# Patient Record
Sex: Female | Born: 1965 | Race: White | Hispanic: No | Smoking: Current some day smoker
Health system: Southern US, Community
[De-identification: ages and names within clinical notes are randomized; demographics above are authoritative.]

## PROBLEM LIST (undated history)

## (undated) DIAGNOSIS — D508 Other iron deficiency anemias: Secondary | ICD-10-CM

## (undated) DIAGNOSIS — F329 Major depressive disorder, single episode, unspecified: Secondary | ICD-10-CM

## (undated) DIAGNOSIS — J309 Allergic rhinitis, unspecified: Secondary | ICD-10-CM

## (undated) DIAGNOSIS — J449 Chronic obstructive pulmonary disease, unspecified: Secondary | ICD-10-CM

## (undated) DIAGNOSIS — Z91199 Patient's noncompliance with other medical treatment and regimen due to unspecified reason: Secondary | ICD-10-CM

## (undated) DIAGNOSIS — L409 Psoriasis, unspecified: Secondary | ICD-10-CM

## (undated) DIAGNOSIS — J069 Acute upper respiratory infection, unspecified: Secondary | ICD-10-CM

## (undated) DIAGNOSIS — G4733 Obstructive sleep apnea (adult) (pediatric): Secondary | ICD-10-CM

## (undated) DIAGNOSIS — K589 Irritable bowel syndrome without diarrhea: Secondary | ICD-10-CM

## (undated) DIAGNOSIS — E782 Mixed hyperlipidemia: Secondary | ICD-10-CM

## (undated) DIAGNOSIS — Z9989 Dependence on other enabling machines and devices: Secondary | ICD-10-CM

## (undated) DIAGNOSIS — F32A Depression, unspecified: Secondary | ICD-10-CM

## (undated) DIAGNOSIS — E1165 Type 2 diabetes mellitus with hyperglycemia: Secondary | ICD-10-CM

## (undated) DIAGNOSIS — E119 Type 2 diabetes mellitus without complications: Secondary | ICD-10-CM

## (undated) DIAGNOSIS — F419 Anxiety disorder, unspecified: Secondary | ICD-10-CM

## (undated) DIAGNOSIS — J45909 Unspecified asthma, uncomplicated: Secondary | ICD-10-CM

## (undated) DIAGNOSIS — E781 Pure hyperglyceridemia: Secondary | ICD-10-CM

## (undated) HISTORY — DX: Obstructive sleep apnea (adult) (pediatric): G47.33

## (undated) HISTORY — DX: Dependence on other enabling machines and devices: Z99.89

## (undated) HISTORY — DX: Morbid (severe) obesity due to excess calories: E66.01

## (undated) HISTORY — DX: Depression, unspecified: F32.A

## (undated) HISTORY — DX: Other iron deficiency anemias: D50.8

## (undated) HISTORY — DX: Pure hyperglyceridemia: E78.1

## (undated) HISTORY — DX: Psoriasis, unspecified: L40.9

## (undated) HISTORY — DX: Type 2 diabetes mellitus with hyperglycemia: E11.65

## (undated) HISTORY — DX: Irritable bowel syndrome, unspecified: K58.9

## (undated) HISTORY — DX: Acute upper respiratory infection, unspecified: J06.9

## (undated) HISTORY — DX: Chronic obstructive pulmonary disease, unspecified: J44.9

## (undated) HISTORY — DX: Major depressive disorder, single episode, unspecified: F32.9

## (undated) HISTORY — PX: COLONOSCOPY: SHX174

## (undated) HISTORY — DX: Patient's noncompliance with other medical treatment and regimen due to unspecified reason: Z91.199

## (undated) HISTORY — DX: Allergic rhinitis, unspecified: J30.9

## (undated) HISTORY — DX: Mixed hyperlipidemia: E78.2

## (undated) HISTORY — DX: Anxiety disorder, unspecified: F41.9

---

## 2002-08-27 ENCOUNTER — Ambulatory Visit (HOSPITAL_COMMUNITY): Admission: RE | Admit: 2002-08-27 | Discharge: 2002-08-27 | Payer: Self-pay | Admitting: Family Medicine

## 2002-08-27 ENCOUNTER — Encounter: Payer: Self-pay | Admitting: Family Medicine

## 2003-07-25 ENCOUNTER — Other Ambulatory Visit: Admission: RE | Admit: 2003-07-25 | Discharge: 2003-07-25 | Payer: Self-pay | Admitting: Obstetrics and Gynecology

## 2004-02-29 ENCOUNTER — Ambulatory Visit (HOSPITAL_COMMUNITY): Admission: RE | Admit: 2004-02-29 | Discharge: 2004-02-29 | Payer: Self-pay | Admitting: Family Medicine

## 2004-08-08 ENCOUNTER — Other Ambulatory Visit: Admission: RE | Admit: 2004-08-08 | Discharge: 2004-08-08 | Payer: Self-pay | Admitting: Obstetrics and Gynecology

## 2004-10-19 ENCOUNTER — Ambulatory Visit (HOSPITAL_COMMUNITY): Admission: RE | Admit: 2004-10-19 | Discharge: 2004-10-19 | Payer: Self-pay | Admitting: Family Medicine

## 2009-03-16 ENCOUNTER — Encounter: Admission: RE | Admit: 2009-03-16 | Discharge: 2009-03-16 | Payer: Self-pay | Admitting: Obstetrics and Gynecology

## 2010-04-16 ENCOUNTER — Encounter (INDEPENDENT_AMBULATORY_CARE_PROVIDER_SITE_OTHER): Payer: Self-pay | Admitting: *Deleted

## 2010-07-21 ENCOUNTER — Other Ambulatory Visit: Payer: Self-pay | Admitting: Obstetrics and Gynecology

## 2010-07-21 DIAGNOSIS — N6320 Unspecified lump in the left breast, unspecified quadrant: Secondary | ICD-10-CM

## 2010-07-22 ENCOUNTER — Encounter: Payer: Self-pay | Admitting: Internal Medicine

## 2010-07-31 NOTE — Letter (Signed)
Summary: Unable to Reach, Consult Scheduled  [facility]  [address]   [place], [place] [address]   Phone: [phone]  Fax: [phone]    04/16/2010  Kristin Austin [address] [place], [place]  [address] 05-03-1966   Dear [name],   We have been unable to reach you by phone.  Please contact our office with an updated phone number.  At the recommendation of DR Phillips Odor we have been asked to schedule you a consult with DR [facility] DIARRHEA.   Please call our office at [phone].     Thank you,    Diana Eves  [facility] R. Roetta Sessions, M.D.    Jonette Eva, M.D. Lorenza Burton, FNP-BC    Tana Coast, PA-C Phone: [phone]    Fax: [phone]

## 2011-09-05 ENCOUNTER — Other Ambulatory Visit: Payer: Self-pay | Admitting: Obstetrics and Gynecology

## 2011-09-05 ENCOUNTER — Ambulatory Visit
Admission: RE | Admit: 2011-09-05 | Discharge: 2011-09-05 | Disposition: A | Discharge status: Home / Self Care | Payer: BC Managed Care – PPO | Source: Ambulatory Visit | Attending: Obstetrics and Gynecology | Admitting: Obstetrics and Gynecology

## 2011-09-05 DIAGNOSIS — N6320 Unspecified lump in the left breast, unspecified quadrant: Secondary | ICD-10-CM

## 2012-05-08 ENCOUNTER — Ambulatory Visit (HOSPITAL_COMMUNITY)
Admission: RE | Admit: 2012-05-08 | Discharge: 2012-05-08 | Disposition: A | Discharge status: Home / Self Care | Payer: BC Managed Care – PPO | Source: Ambulatory Visit | Attending: Physician Assistant | Admitting: Physician Assistant

## 2012-05-08 ENCOUNTER — Other Ambulatory Visit (HOSPITAL_COMMUNITY): Payer: Self-pay | Admitting: Physician Assistant

## 2012-05-08 DIAGNOSIS — R0602 Shortness of breath: Secondary | ICD-10-CM | POA: Insufficient documentation

## 2012-05-08 DIAGNOSIS — F17200 Nicotine dependence, unspecified, uncomplicated: Secondary | ICD-10-CM | POA: Insufficient documentation

## 2012-05-08 DIAGNOSIS — Z0000 Encounter for general adult medical examination without abnormal findings: Secondary | ICD-10-CM

## 2012-05-08 DIAGNOSIS — R05 Cough: Secondary | ICD-10-CM

## 2012-05-08 DIAGNOSIS — R059 Cough, unspecified: Secondary | ICD-10-CM

## 2012-09-16 ENCOUNTER — Emergency Department (HOSPITAL_COMMUNITY)
Admission: EM | Admit: 2012-09-16 | Discharge: 2012-09-17 | Disposition: A | Discharge status: Home / Self Care | Payer: BC Managed Care – PPO | Attending: Emergency Medicine | Admitting: Emergency Medicine

## 2012-09-16 ENCOUNTER — Encounter (HOSPITAL_COMMUNITY): Payer: Self-pay

## 2012-09-16 DIAGNOSIS — IMO0001 Reserved for inherently not codable concepts without codable children: Secondary | ICD-10-CM | POA: Insufficient documentation

## 2012-09-16 DIAGNOSIS — Z79899 Other long term (current) drug therapy: Secondary | ICD-10-CM | POA: Insufficient documentation

## 2012-09-16 DIAGNOSIS — F17200 Nicotine dependence, unspecified, uncomplicated: Secondary | ICD-10-CM | POA: Insufficient documentation

## 2012-09-16 DIAGNOSIS — J45909 Unspecified asthma, uncomplicated: Secondary | ICD-10-CM | POA: Insufficient documentation

## 2012-09-16 DIAGNOSIS — M791 Myalgia, unspecified site: Secondary | ICD-10-CM

## 2012-09-16 HISTORY — DX: Unspecified asthma, uncomplicated: J45.909

## 2012-09-16 LAB — URINALYSIS, ROUTINE W REFLEX MICROSCOPIC
Ketones, ur: NEGATIVE mg/dL
Leukocytes, UA: NEGATIVE
Nitrite: NEGATIVE
Specific Gravity, Urine: >1.03 — ABNORMAL HIGH (ref 1.005–1.030)
pH: 6 (ref 5.0–8.0)

## 2012-09-16 MED ORDER — IBUPROFEN 800 MG PO TABS
800.0000 mg | ORAL_TABLET | Freq: Once | ORAL | Status: AC
  Administered 2012-09-16: 800 mg via ORAL
  Filled 2012-09-16: qty 1

## 2012-09-16 MED ORDER — CYCLOBENZAPRINE HCL 10 MG PO TABS
10.0000 mg | ORAL_TABLET | Freq: Once | ORAL | Status: AC
Start: 2012-09-16 — End: 2012-09-16
  Administered 2012-09-16: 10 mg via ORAL
  Filled 2012-09-16: qty 1

## 2012-09-16 NOTE — ED Notes (Signed)
Lower right side abd pain, onset last week after a cough. Now pain increased and radiating to right flank. Also had vomiting/diarrhea 09/05/12

## 2012-09-16 NOTE — ED Provider Notes (Signed)
History  This chart was scribed for [employer]. Colon Branch, MD by Bennett Scrape, ED Scribe. This patient was seen in room APA12/APA12 and the patient's care was started at 11:07 PM.  CSN: [id number]  Arrival date & time 09/16/12  2134   First MD Initiated Contact with Patient 09/16/12 2307      Chief Complaint  Patient presents with  . Abdominal Pain    The history is provided by the patient. No language interpreter was used.    Kristin Austin is a 47 y.o. female who presents to the Emergency Department complaining of 7 days of gradual onset, constant RLQ abdominal pain that radiates to the right flank that became worse after an unanticipated sneeze at work today. The pain is improved with laying and is worsened with movement, smoker's cough and sneezing. She denies taking OTC medications at home to improve symptoms. She reports that she has diarrhea from a virus that she had last week but denies any changes. She denies any other symptoms. She has a h/o asthma and is a current everyday smoker but denies alcohol use.  Past Medical History  Diagnosis Date  . Asthma     History reviewed. No pertinent past surgical history.  History reviewed. No pertinent family history.  History  Substance Use Topics  . Smoking status: Current Every Day Smoker -- 1.00 packs/day for 20 years    Types: Cigarettes  . Smokeless tobacco: Not on file  . Alcohol Use: No    No OB history provided.  Review of Systems  A complete 10 system review of systems was obtained and all systems are negative except as noted in the HPI and PMH.   Allergies  Codeine and Penicillins  Home Medications   Current Outpatient Rx  Name  Route  Sig  Dispense  Refill  . acetaminophen (TYLENOL) 500 MG tablet   Oral   Take 1,000 mg by mouth daily as needed for pain.         [name] buPROPion (WELLBUTRIN XL) 300 MG 24 hr tablet   Oral   Take 300 mg by mouth every morning.         . escitalopram (LEXAPRO) 10 MG tablet  Oral   Take 10 mg by mouth at bedtime.         . pravastatin (PRAVACHOL) 40 MG tablet   Oral   Take 40 mg by mouth at bedtime.           Triage Vitals: BP 140/79  Pulse 95  Temp(Src) 97.9 F (36.6 C) (Oral)  Ht 5\' 3"  (1.6 m)  Wt 220 lb (99.791 kg)  BMI 38.98 kg/m2  SpO2 98%  LMP 08/15/2012  Physical Exam  Nursing note and vitals reviewed. Constitutional: She is oriented to person, place, and time. She appears well-developed and well-nourished. No distress.  HENT:  Head: Normocephalic and atraumatic.  Eyes: EOM are normal.  Neck: Neck supple. No tracheal deviation present.  Cardiovascular: Normal rate.   Pulmonary/Chest: Effort normal. No respiratory distress.  Musculoskeletal: Normal range of motion.  Neurological: She is alert and oriented to person, place, and time.  Skin: Skin is warm and dry.  Psychiatric: She has a normal mood and affect. Her behavior is normal.    ED Course  Procedures (including critical care time) Results for orders placed during the hospital encounter of 09/16/12  URINALYSIS, ROUTINE W REFLEX MICROSCOPIC      Result Value Range   Color, Urine YELLOW  YELLOW  APPearance CLEAR  CLEAR   Specific Gravity, Urine >1.030 (*) 1.005 - 1.030   pH 6.0  5.0 - 8.0   Glucose, UA NEGATIVE  NEGATIVE mg/dL   Hgb urine dipstick NEGATIVE  NEGATIVE   Bilirubin Urine NEGATIVE  NEGATIVE   Ketones, ur NEGATIVE  NEGATIVE mg/dL   Protein, ur NEGATIVE  NEGATIVE mg/dL   Urobilinogen, UA 0.2  0.0 - 1.0 mg/dL   Nitrite NEGATIVE  NEGATIVE   Leukocytes, UA NEGATIVE  NEGATIVE   DIAGNOSTIC STUDIES: Oxygen Saturation is 98% on room air, normal by my interpretation.    COORDINATION OF CARE: 11:29 PM-Informed pt that her symptoms are from a pulled muscle. Discussed discharge plan which includes ibuprofen and heat with pt and pt agreed to plan.     MDM  Patient with muscle pain to the right side made worse with coughing and sneezing. Given ibuprofen and  flexeril. Pt stable in ED with no significant deterioration in condition.The patient appears reasonably screened and/or stabilized for discharge and I doubt any other medical condition or other [facility] requiring further screening, evaluation, or treatment in the ED at this time prior to discharge.  I personally performed the services described in this documentation, which was scribed in my presence. The recorded information has been reviewed and considered.   MDM Reviewed: nursing note and vitals Interpretation: labs           Nicoletta Dress. Colon Branch, MD 09/17/12 0111

## 2012-09-17 MED ORDER — CYCLOBENZAPRINE HCL 10 MG PO TABS: 10.0000 mg | ORAL_TABLET | Freq: Two times a day (BID) | ORAL | Status: DC | PRN

## 2012-09-17 MED ORDER — IBUPROFEN 800 MG PO TABS: 800.0000 mg | ORAL_TABLET | Freq: Three times a day (TID) | ORAL | Status: DC

## 2012-11-02 ENCOUNTER — Other Ambulatory Visit: Payer: Self-pay | Admitting: Obstetrics and Gynecology

## 2012-11-02 DIAGNOSIS — R921 Mammographic calcification found on diagnostic imaging of breast: Secondary | ICD-10-CM

## 2013-01-08 ENCOUNTER — Other Ambulatory Visit: Payer: Self-pay | Admitting: Obstetrics and Gynecology

## 2013-01-08 ENCOUNTER — Ambulatory Visit
Admission: RE | Admit: 2013-01-08 | Discharge: 2013-01-08 | Disposition: A | Discharge status: Home / Self Care | Payer: BC Managed Care – PPO | Source: Ambulatory Visit | Attending: Obstetrics and Gynecology | Admitting: Obstetrics and Gynecology

## 2013-01-08 DIAGNOSIS — R921 Mammographic calcification found on diagnostic imaging of breast: Secondary | ICD-10-CM

## 2013-03-08 ENCOUNTER — Ambulatory Visit
Admission: RE | Admit: 2013-03-08 | Discharge: 2013-03-08 | Disposition: A | Discharge status: Home / Self Care | Payer: BC Managed Care – PPO | Source: Ambulatory Visit | Attending: Obstetrics and Gynecology | Admitting: Obstetrics and Gynecology

## 2013-03-08 DIAGNOSIS — R921 Mammographic calcification found on diagnostic imaging of breast: Secondary | ICD-10-CM

## 2014-07-11 ENCOUNTER — Encounter: Payer: Self-pay | Admitting: Internal Medicine

## 2014-07-14 ENCOUNTER — Other Ambulatory Visit: Payer: Self-pay | Admitting: Obstetrics and Gynecology

## 2014-07-14 DIAGNOSIS — R921 Mammographic calcification found on diagnostic imaging of breast: Secondary | ICD-10-CM

## 2014-07-15 ENCOUNTER — Other Ambulatory Visit: Payer: Self-pay | Admitting: Obstetrics and Gynecology

## 2014-07-18 LAB — CYTOLOGY - PAP

## 2014-07-29 ENCOUNTER — Inpatient Hospital Stay: Admission: RE | Admit: 2014-07-29 | Payer: Self-pay | Source: Ambulatory Visit

## 2014-07-29 ENCOUNTER — Other Ambulatory Visit: Payer: Self-pay

## 2014-08-01 ENCOUNTER — Ambulatory Visit: Payer: BC Managed Care – PPO | Admitting: Nurse Practitioner

## 2014-08-19 ENCOUNTER — Ambulatory Visit
Admission: RE | Admit: 2014-08-19 | Discharge: 2014-08-19 | Disposition: A | Discharge status: Home / Self Care | Payer: BLUE CROSS/BLUE SHIELD | Source: Ambulatory Visit | Attending: Obstetrics and Gynecology | Admitting: Obstetrics and Gynecology

## 2014-08-19 ENCOUNTER — Other Ambulatory Visit: Payer: Self-pay | Admitting: Obstetrics and Gynecology

## 2014-08-19 DIAGNOSIS — R921 Mammographic calcification found on diagnostic imaging of breast: Secondary | ICD-10-CM

## 2014-09-05 ENCOUNTER — Ambulatory Visit: Payer: Self-pay | Admitting: Internal Medicine

## 2014-11-15 ENCOUNTER — Ambulatory Visit (HOSPITAL_COMMUNITY)
Admission: RE | Admit: 2014-11-15 | Discharge: 2014-11-15 | Disposition: A | Discharge status: Home / Self Care | Payer: BLUE CROSS/BLUE SHIELD | Source: Ambulatory Visit | Attending: Family Medicine | Admitting: Family Medicine

## 2014-11-15 ENCOUNTER — Other Ambulatory Visit (HOSPITAL_COMMUNITY): Payer: Self-pay | Admitting: Family Medicine

## 2014-11-15 DIAGNOSIS — F17210 Nicotine dependence, cigarettes, uncomplicated: Secondary | ICD-10-CM

## 2014-11-15 DIAGNOSIS — R0602 Shortness of breath: Secondary | ICD-10-CM

## 2014-11-15 DIAGNOSIS — R05 Cough: Secondary | ICD-10-CM | POA: Diagnosis not present

## 2014-12-22 ENCOUNTER — Other Ambulatory Visit: Payer: Self-pay | Admitting: Obstetrics and Gynecology

## 2014-12-22 DIAGNOSIS — R921 Mammographic calcification found on diagnostic imaging of breast: Secondary | ICD-10-CM

## 2014-12-23 ENCOUNTER — Other Ambulatory Visit: Payer: Self-pay | Admitting: Obstetrics and Gynecology

## 2014-12-23 ENCOUNTER — Other Ambulatory Visit: Payer: Self-pay

## 2014-12-23 DIAGNOSIS — R921 Mammographic calcification found on diagnostic imaging of breast: Secondary | ICD-10-CM

## 2015-02-24 ENCOUNTER — Other Ambulatory Visit: Payer: BLUE CROSS/BLUE SHIELD

## 2015-03-24 ENCOUNTER — Other Ambulatory Visit: Payer: Self-pay | Admitting: Obstetrics and Gynecology

## 2015-03-24 ENCOUNTER — Ambulatory Visit
Admission: RE | Admit: 2015-03-24 | Discharge: 2015-03-24 | Disposition: A | Discharge status: Home / Self Care | Payer: BLUE CROSS/BLUE SHIELD | Source: Ambulatory Visit | Attending: Obstetrics and Gynecology | Admitting: Obstetrics and Gynecology

## 2015-03-24 DIAGNOSIS — R921 Mammographic calcification found on diagnostic imaging of breast: Secondary | ICD-10-CM

## 2015-07-05 ENCOUNTER — Encounter: Payer: Self-pay | Admitting: *Deleted

## 2015-07-14 ENCOUNTER — Ambulatory Visit: Payer: BLUE CROSS/BLUE SHIELD | Admitting: Cardiology

## 2015-10-27 ENCOUNTER — Encounter: Payer: Self-pay | Admitting: Cardiology

## 2015-10-27 ENCOUNTER — Ambulatory Visit (INDEPENDENT_AMBULATORY_CARE_PROVIDER_SITE_OTHER): Payer: BLUE CROSS/BLUE SHIELD | Admitting: Cardiology

## 2015-10-27 ENCOUNTER — Encounter: Payer: BLUE CROSS/BLUE SHIELD | Admitting: Cardiology

## 2015-10-27 VITALS — BP 116/74 | HR 87 | Ht 63.0 in | Wt 215.0 lb

## 2015-10-27 DIAGNOSIS — R002 Palpitations: Secondary | ICD-10-CM

## 2015-10-27 DIAGNOSIS — Z720 Tobacco use: Secondary | ICD-10-CM

## 2015-10-27 DIAGNOSIS — R0600 Dyspnea, unspecified: Secondary | ICD-10-CM | POA: Diagnosis not present

## 2015-10-27 DIAGNOSIS — E663 Overweight: Secondary | ICD-10-CM

## 2015-10-27 NOTE — Progress Notes (Signed)
Cardiology Office Note  Date: 10/27/2015   ID: [name], DOB Feb 25, 1966, MRN [id number]  PCP: Colette Ribas, MD  Consulting Cardiologist: Nona Dell, MD   Chief Complaint  Patient presents with  . Palpitations    History of Present Illness: Kristin Austin is a 50 y.o. female referred for cardiology consultation by Dr. Phillips Odor. I reviewed her records and updated the chart. She reports a six-month history of a sense of palpitations, specifically describes a feeling that her heart rate is fast a lot of the time, sometimes forceful. She more often notices this at nighttime when she is going to sleep. She has had no associated dizziness or syncope. Reports mild dyspnea on exertion that has been stable, NYHA class II. She has obstructive sleep apnea and reports compliance with CPAP, generally sleeps well. She works in [employer] trader, does report some job stress. She is not exercising at this time and states that her diet is fairly poor.  Family history reviewed, no definitive cardiac arrhythmias or sudden cardiac death. She does have diabetes mellitus in both parents, states that she has been told that her blood sugars have been running high as well.  I reviewed her medications which are outlined below. She reports no new medications in the last 6 months.  Past Medical History  Diagnosis Date  . Asthma   . Allergic rhinitis   . Depression   . OSA on CPAP     History reviewed. No pertinent past surgical history.  Current Outpatient Prescriptions  Medication Sig Dispense Refill  . ibuprofen (ADVIL,MOTRIN) 800 MG tablet Take 1 tablet (800 mg total) by mouth 3 (three) times daily. 21 tablet 0  . buPROPion (WELLBUTRIN XL) 150 MG 24 hr tablet Take 150 mg by mouth daily.   5  . fenofibrate 160 MG tablet Take 160 mg by mouth daily.   5   No current facility-administered medications for this visit.   Allergies:  Codeine and Penicillins   Social History: The  patient  reports that she has been smoking Cigarettes.  She has a 20 pack-year smoking history. She does not have any smokeless tobacco history on file. She reports that she does not drink alcohol or use illicit drugs.   Family History: The patient's family history includes Cancer in her mother; Diabetes Mellitus II in her father and mother; Hypertension in her sister; Prostate cancer in her father.   ROS:  Please see the history of present illness. Otherwise, complete review of systems is positive for mild dyspnea exertion, history of asthma.  All other systems are reviewed and negative.   Physical Exam: VS:  BP 116/74 mmHg  Pulse 87  Ht  (1.6 m)  Wt 215 lb (97.523 kg)  BMI 38.09 kg/m2  SpO2 93%, BMI Body mass index is 38.09 kg/(m^2).  Wt Readings from Last 3 Encounters:  10/27/15 215 lb (97.523 kg)  09/16/12 220 lb (99.791 kg)    General: Overweight woman, appears comfortable at rest. HEENT: Conjunctiva and lids normal, oropharynx clear. Neck: Supple, no elevated JVP or carotid bruits, no thyromegaly. Lungs: Clear to auscultation, nonlabored breathing at rest. Cardiac: Regular rate and rhythm, no S3 or significant systolic murmur, no pericardial rub. Abdomen: Soft, nontender, bowel sounds present. Extremities: No pitting edema, distal pulses 2+. Skin: Warm and dry. Musculoskeletal: No kyphosis. Neuropsychiatric: Alert and oriented x3, affect grossly appropriate.  ECG: I personally reviewed the prior tracing from 04/20/2015 which showed sinus rhythm with  R' in lead V1 and lead artifact.  Recent Labwork:  October 2016: BUN 16, creatinine 0.75, potassium 4.0, AST 23, ALT 35, cholesterol 214, triglycerides 284, HDL 26, LDL 131, TSH 1.7, hemoglobin A1c 6.5  Other Studies Reviewed Today:  Chest x-ray 11/15/2014: FINDINGS: The lungs are adequately inflated and clear. The heart and pulmonary vascularity are normal. The mediastinum is normal in width. There is no pleural  effusion. The cardiac silhouette and pulmonary vascularity are normal. The trachea is midline. The bony thorax is unremarkable.  IMPRESSION: There is no active cardiopulmonary disease.  Assessment and Plan:  1. Palpitations, mainly describes a sense of fast heart rate, sometimes forceful heartbeats. She has had no associated dizziness or syncope. She reports nearly daily symptoms, we will obtain a 48-hour Holter monitor to further investigate for any potential arrhythmias or increased ectopy. She had a normal TSH done within the last 6 months. Baseline ECG is essentially normal.  2. Mild dyspnea on exertion, history of asthma and allergic rhinitis. With associated palpitations we will also obtain an echocardiogram to assess cardiac structure and function.  3. We discussed lifestyle modification including diet and exercise. She would likely benefit many respects from this. With weight loss her sleep apnea may be even better controlled and risk for developing type 2 diabetes mellitus will also decrease.  4. Ongoing tobacco abuse. Smoking cessation would be beneficial as well.  Current medicines were reviewed with the patient today.   Orders Placed This Encounter  Procedures  . Holter monitor - 48 hour  . Echocardiogram    Disposition: Call with results.   Signed, Jonelle SidleSamuel G. McDowell, MD, [facility] Hospital[facility]ACC 10/27/2015 1:37 PM    [facility] [address], [place], [place] [address] Phone: [phone]; Fax: [phone]

## 2015-10-27 NOTE — Progress Notes (Signed)
Error   This encounter was created in error - please disregard. 

## 2015-10-27 NOTE — Patient Instructions (Signed)
Your physician recommends that you schedule a follow-up appointment in: to be determined after tests    Your physician has requested that you have an echocardiogram. Echocardiography is a painless test that uses sound waves to create images of your heart. It provides your doctor with information about the size and shape of your heart and how well your heart's chambers and valves are working. This procedure takes approximately one hour. There are no restrictions for this procedure.  Your physician has recommended that you wear a holter monitor. Holter monitors are medical devices that record the heart's electrical activity. Doctors most often use these monitors to diagnose arrhythmias. Arrhythmias are problems with the speed or rhythm of the heartbeat. The monitor is a small, portable device. You can wear one while you do your normal daily activities. This is usually used to diagnose what is causing palpitations/syncope (passing out).    Thank you for choosing [facility] !         

## 2015-11-07 ENCOUNTER — Ambulatory Visit (HOSPITAL_COMMUNITY): Payer: BLUE CROSS/BLUE SHIELD

## 2015-11-07 ENCOUNTER — Ambulatory Visit (HOSPITAL_COMMUNITY): Admission: RE | Admit: 2015-11-07 | Payer: BLUE CROSS/BLUE SHIELD | Source: Ambulatory Visit

## 2015-11-09 ENCOUNTER — Ambulatory Visit: Payer: BLUE CROSS/BLUE SHIELD | Admitting: Cardiology

## 2015-11-14 ENCOUNTER — Ambulatory Visit (HOSPITAL_COMMUNITY)
Admission: RE | Admit: 2015-11-14 | Discharge: 2015-11-14 | Disposition: A | Discharge status: Home / Self Care | Payer: BLUE CROSS/BLUE SHIELD | Source: Ambulatory Visit | Attending: Cardiology | Admitting: Cardiology

## 2015-11-14 ENCOUNTER — Ambulatory Visit (HOSPITAL_BASED_OUTPATIENT_CLINIC_OR_DEPARTMENT_OTHER)
Admission: RE | Admit: 2015-11-14 | Discharge: 2015-11-14 | Disposition: A | Discharge status: Home / Self Care | Payer: BLUE CROSS/BLUE SHIELD | Source: Ambulatory Visit | Attending: Cardiology | Admitting: Cardiology

## 2015-11-14 DIAGNOSIS — R0600 Dyspnea, unspecified: Secondary | ICD-10-CM

## 2015-11-14 DIAGNOSIS — R002 Palpitations: Secondary | ICD-10-CM | POA: Diagnosis not present

## 2015-11-15 ENCOUNTER — Telehealth: Payer: Self-pay | Admitting: Cardiology

## 2015-11-15 ENCOUNTER — Telehealth: Payer: Self-pay | Admitting: *Deleted

## 2015-11-15 NOTE — Telephone Encounter (Signed)
Pt is returning Kisha's call

## 2015-11-15 NOTE — Telephone Encounter (Signed)
Called patient with test results. No answer. Left message to call back.  

## 2015-11-15 NOTE — Telephone Encounter (Signed)
Patient given test result

## 2015-11-15 NOTE — Telephone Encounter (Signed)
-----   Message from Jonelle SidleSamuel G McDowell, MD sent at 11/14/2015  2:38 PM EDT ----- Reviewed report. Normal LVEF and no major valvular abnormalities to explain symptoms. Overall reassuring.

## 2015-12-08 DIAGNOSIS — L259 Unspecified contact dermatitis, unspecified cause: Secondary | ICD-10-CM | POA: Diagnosis not present

## 2015-12-19 DIAGNOSIS — E781 Pure hyperglyceridemia: Secondary | ICD-10-CM | POA: Diagnosis not present

## 2015-12-19 DIAGNOSIS — J45909 Unspecified asthma, uncomplicated: Secondary | ICD-10-CM | POA: Diagnosis not present

## 2015-12-19 DIAGNOSIS — E119 Type 2 diabetes mellitus without complications: Secondary | ICD-10-CM | POA: Diagnosis not present

## 2015-12-19 DIAGNOSIS — Z1389 Encounter for screening for other disorder: Secondary | ICD-10-CM | POA: Diagnosis not present

## 2015-12-19 DIAGNOSIS — E782 Mixed hyperlipidemia: Secondary | ICD-10-CM | POA: Diagnosis not present

## 2015-12-21 DIAGNOSIS — E119 Type 2 diabetes mellitus without complications: Secondary | ICD-10-CM | POA: Diagnosis not present

## 2015-12-21 DIAGNOSIS — Z1389 Encounter for screening for other disorder: Secondary | ICD-10-CM | POA: Diagnosis not present

## 2015-12-21 DIAGNOSIS — E781 Pure hyperglyceridemia: Secondary | ICD-10-CM | POA: Diagnosis not present

## 2015-12-21 DIAGNOSIS — E782 Mixed hyperlipidemia: Secondary | ICD-10-CM | POA: Diagnosis not present

## 2016-02-16 DIAGNOSIS — Z01419 Encounter for gynecological examination (general) (routine) without abnormal findings: Secondary | ICD-10-CM | POA: Diagnosis not present

## 2016-02-16 DIAGNOSIS — Z6835 Body mass index (BMI) 35.0-35.9, adult: Secondary | ICD-10-CM | POA: Diagnosis not present

## 2016-03-14 DIAGNOSIS — E6609 Other obesity due to excess calories: Secondary | ICD-10-CM | POA: Diagnosis not present

## 2016-03-14 DIAGNOSIS — E119 Type 2 diabetes mellitus without complications: Secondary | ICD-10-CM | POA: Diagnosis not present

## 2016-03-14 DIAGNOSIS — Z1389 Encounter for screening for other disorder: Secondary | ICD-10-CM | POA: Diagnosis not present

## 2016-03-14 DIAGNOSIS — J301 Allergic rhinitis due to pollen: Secondary | ICD-10-CM | POA: Diagnosis not present

## 2016-03-14 DIAGNOSIS — Z6835 Body mass index (BMI) 35.0-35.9, adult: Secondary | ICD-10-CM | POA: Diagnosis not present

## 2016-03-30 ENCOUNTER — Emergency Department (HOSPITAL_COMMUNITY)
Admission: EM | Admit: 2016-03-30 | Discharge: 2016-03-30 | Disposition: A | Discharge status: Home / Self Care | Payer: BLUE CROSS/BLUE SHIELD | Attending: Emergency Medicine | Admitting: Emergency Medicine

## 2016-03-30 ENCOUNTER — Encounter (HOSPITAL_COMMUNITY): Payer: Self-pay | Admitting: Cardiology

## 2016-03-30 ENCOUNTER — Emergency Department (HOSPITAL_COMMUNITY): Payer: BLUE CROSS/BLUE SHIELD

## 2016-03-30 DIAGNOSIS — J45909 Unspecified asthma, uncomplicated: Secondary | ICD-10-CM | POA: Diagnosis not present

## 2016-03-30 DIAGNOSIS — F17210 Nicotine dependence, cigarettes, uncomplicated: Secondary | ICD-10-CM | POA: Diagnosis not present

## 2016-03-30 DIAGNOSIS — S8002XA Contusion of left knee, initial encounter: Secondary | ICD-10-CM | POA: Diagnosis not present

## 2016-03-30 DIAGNOSIS — M25562 Pain in left knee: Secondary | ICD-10-CM | POA: Diagnosis not present

## 2016-03-30 DIAGNOSIS — Z79899 Other long term (current) drug therapy: Secondary | ICD-10-CM | POA: Diagnosis not present

## 2016-03-30 MED ORDER — IBUPROFEN 800 MG PO TABS
800.0000 mg | ORAL_TABLET | Freq: Once | ORAL | Status: AC
  Administered 2016-03-30: 800 mg via ORAL
  Filled 2016-03-30: qty 1

## 2016-03-30 MED ORDER — IBUPROFEN 800 MG PO TABS: 800.0000 mg | ORAL_TABLET | Freq: Three times a day (TID) | ORAL | 0 refills | Status: DC

## 2016-03-30 NOTE — Discharge Instructions (Signed)
Xray normal Return if symptoms worsen / redness / fevers or increased  pain Motrin 3 times daily Family doctor in 1 week  Please obtain all of your results from medical records or have your doctors office obtain the results - share them with your doctor - you should be seen at your doctors office in the next 2 days. Call today to arrange your follow up. Take the medications as prescribed. Please review all of the medicines and only take them if you do not have an allergy to them. Please be aware that if you are taking birth control pills, taking other prescriptions, ESPECIALLY ANTIBIOTICS may make the birth control ineffective - if this is the case, either do not engage in sexual activity or use alternative methods of birth control such as condoms until you have finished the medicine and your family doctor says it is OK to restart them. If you are on a blood thinner such as COUMADIN, be aware that any other medicine that you take may cause the coumadin to either work too much, or not enough - you should have your coumadin level rechecked in next 7 days if this is the case.  ?  It is also a possibility that you have an allergic reaction to any of the medicines that you have been prescribed - Everybody reacts differently to medications and while MOST people have no trouble with most medicines, you may have a reaction such as nausea, vomiting, rash, swelling, shortness of breath. If this is the case, please stop taking the medicine immediately and contact your physician.  ?  You should return to the ER if you develop severe or worsening symptoms.
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
## 2016-04-23 DIAGNOSIS — J209 Acute bronchitis, unspecified: Secondary | ICD-10-CM | POA: Diagnosis not present

## 2016-04-23 DIAGNOSIS — R0602 Shortness of breath: Secondary | ICD-10-CM | POA: Diagnosis not present

## 2016-04-23 DIAGNOSIS — E6609 Other obesity due to excess calories: Secondary | ICD-10-CM | POA: Diagnosis not present

## 2016-04-23 DIAGNOSIS — Z6833 Body mass index (BMI) 33.0-33.9, adult: Secondary | ICD-10-CM | POA: Diagnosis not present

## 2016-04-23 DIAGNOSIS — Z1389 Encounter for screening for other disorder: Secondary | ICD-10-CM | POA: Diagnosis not present

## 2016-04-23 DIAGNOSIS — E119 Type 2 diabetes mellitus without complications: Secondary | ICD-10-CM | POA: Diagnosis not present

## 2016-05-07 DIAGNOSIS — R05 Cough: Secondary | ICD-10-CM | POA: Diagnosis not present

## 2016-05-07 DIAGNOSIS — J4521 Mild intermittent asthma with (acute) exacerbation: Secondary | ICD-10-CM | POA: Diagnosis not present

## 2016-05-07 DIAGNOSIS — F329 Major depressive disorder, single episode, unspecified: Secondary | ICD-10-CM | POA: Diagnosis not present

## 2016-05-07 DIAGNOSIS — Z6834 Body mass index (BMI) 34.0-34.9, adult: Secondary | ICD-10-CM | POA: Diagnosis not present

## 2016-05-07 DIAGNOSIS — Z1389 Encounter for screening for other disorder: Secondary | ICD-10-CM | POA: Diagnosis not present

## 2016-05-09 ENCOUNTER — Other Ambulatory Visit (HOSPITAL_COMMUNITY): Payer: Self-pay | Admitting: Internal Medicine

## 2016-05-09 ENCOUNTER — Ambulatory Visit (HOSPITAL_COMMUNITY)
Admission: RE | Admit: 2016-05-09 | Discharge: 2016-05-09 | Disposition: A | Discharge status: Home / Self Care | Payer: BLUE CROSS/BLUE SHIELD | Source: Ambulatory Visit | Attending: Internal Medicine | Admitting: Internal Medicine

## 2016-05-09 DIAGNOSIS — R05 Cough: Secondary | ICD-10-CM | POA: Diagnosis not present

## 2016-05-09 DIAGNOSIS — R059 Cough, unspecified: Secondary | ICD-10-CM

## 2016-08-06 DIAGNOSIS — N950 Postmenopausal bleeding: Secondary | ICD-10-CM | POA: Diagnosis not present

## 2016-09-02 ENCOUNTER — Other Ambulatory Visit: Payer: Self-pay | Admitting: Obstetrics and Gynecology

## 2016-09-02 DIAGNOSIS — N6001 Solitary cyst of right breast: Secondary | ICD-10-CM | POA: Diagnosis not present

## 2016-09-06 ENCOUNTER — Ambulatory Visit
Admission: RE | Admit: 2016-09-06 | Discharge: 2016-09-06 | Disposition: A | Discharge status: Home / Self Care | Payer: BLUE CROSS/BLUE SHIELD | Source: Ambulatory Visit | Attending: Obstetrics and Gynecology | Admitting: Obstetrics and Gynecology

## 2016-09-06 DIAGNOSIS — R921 Mammographic calcification found on diagnostic imaging of breast: Secondary | ICD-10-CM | POA: Diagnosis not present

## 2016-09-06 DIAGNOSIS — N6489 Other specified disorders of breast: Secondary | ICD-10-CM | POA: Diagnosis not present

## 2016-09-06 DIAGNOSIS — N6001 Solitary cyst of right breast: Secondary | ICD-10-CM

## 2016-09-13 DIAGNOSIS — N951 Menopausal and female climacteric states: Secondary | ICD-10-CM | POA: Diagnosis not present

## 2016-09-17 DIAGNOSIS — J45909 Unspecified asthma, uncomplicated: Secondary | ICD-10-CM | POA: Diagnosis not present

## 2016-09-17 DIAGNOSIS — R05 Cough: Secondary | ICD-10-CM | POA: Diagnosis not present

## 2016-09-17 DIAGNOSIS — Z6834 Body mass index (BMI) 34.0-34.9, adult: Secondary | ICD-10-CM | POA: Diagnosis not present

## 2016-09-17 DIAGNOSIS — J4530 Mild persistent asthma, uncomplicated: Secondary | ICD-10-CM | POA: Diagnosis not present

## 2016-09-17 DIAGNOSIS — Z1389 Encounter for screening for other disorder: Secondary | ICD-10-CM | POA: Diagnosis not present

## 2016-09-18 ENCOUNTER — Other Ambulatory Visit (HOSPITAL_COMMUNITY): Payer: Self-pay | Admitting: Family Medicine

## 2016-09-18 ENCOUNTER — Ambulatory Visit (HOSPITAL_COMMUNITY)
Admission: RE | Admit: 2016-09-18 | Discharge: 2016-09-18 | Disposition: A | Discharge status: Home / Self Care | Payer: BLUE CROSS/BLUE SHIELD | Source: Ambulatory Visit | Attending: Family Medicine | Admitting: Family Medicine

## 2016-09-18 DIAGNOSIS — R05 Cough: Secondary | ICD-10-CM | POA: Diagnosis not present

## 2016-09-18 DIAGNOSIS — R059 Cough, unspecified: Secondary | ICD-10-CM

## 2016-09-18 DIAGNOSIS — R0602 Shortness of breath: Secondary | ICD-10-CM | POA: Diagnosis not present

## 2016-10-25 DIAGNOSIS — H40013 Open angle with borderline findings, low risk, bilateral: Secondary | ICD-10-CM | POA: Diagnosis not present

## 2016-10-25 DIAGNOSIS — H31091 Other chorioretinal scars, right eye: Secondary | ICD-10-CM | POA: Diagnosis not present

## 2016-11-26 DIAGNOSIS — Z1389 Encounter for screening for other disorder: Secondary | ICD-10-CM | POA: Diagnosis not present

## 2016-11-26 DIAGNOSIS — Z6834 Body mass index (BMI) 34.0-34.9, adult: Secondary | ICD-10-CM | POA: Diagnosis not present

## 2016-11-26 DIAGNOSIS — R7309 Other abnormal glucose: Secondary | ICD-10-CM | POA: Diagnosis not present

## 2016-11-26 DIAGNOSIS — E6609 Other obesity due to excess calories: Secondary | ICD-10-CM | POA: Diagnosis not present

## 2016-11-26 DIAGNOSIS — E782 Mixed hyperlipidemia: Secondary | ICD-10-CM | POA: Diagnosis not present

## 2016-11-27 DIAGNOSIS — E119 Type 2 diabetes mellitus without complications: Secondary | ICD-10-CM | POA: Diagnosis not present

## 2016-11-27 DIAGNOSIS — Z6834 Body mass index (BMI) 34.0-34.9, adult: Secondary | ICD-10-CM | POA: Diagnosis not present

## 2016-11-27 DIAGNOSIS — E782 Mixed hyperlipidemia: Secondary | ICD-10-CM | POA: Diagnosis not present

## 2016-11-27 DIAGNOSIS — Z1389 Encounter for screening for other disorder: Secondary | ICD-10-CM | POA: Diagnosis not present

## 2017-02-06 DIAGNOSIS — N9089 Other specified noninflammatory disorders of vulva and perineum: Secondary | ICD-10-CM | POA: Diagnosis not present

## 2017-02-06 DIAGNOSIS — Z114 Encounter for screening for human immunodeficiency virus [HIV]: Secondary | ICD-10-CM | POA: Diagnosis not present

## 2017-02-07 DIAGNOSIS — E6609 Other obesity due to excess calories: Secondary | ICD-10-CM | POA: Diagnosis not present

## 2017-02-07 DIAGNOSIS — J301 Allergic rhinitis due to pollen: Secondary | ICD-10-CM | POA: Diagnosis not present

## 2017-02-07 DIAGNOSIS — Z6834 Body mass index (BMI) 34.0-34.9, adult: Secondary | ICD-10-CM | POA: Diagnosis not present

## 2017-02-13 DIAGNOSIS — L309 Dermatitis, unspecified: Secondary | ICD-10-CM | POA: Diagnosis not present

## 2017-04-24 DIAGNOSIS — M542 Cervicalgia: Secondary | ICD-10-CM | POA: Diagnosis not present

## 2017-04-24 DIAGNOSIS — M5441 Lumbago with sciatica, right side: Secondary | ICD-10-CM | POA: Diagnosis not present

## 2017-04-24 DIAGNOSIS — M9901 Segmental and somatic dysfunction of cervical region: Secondary | ICD-10-CM | POA: Diagnosis not present

## 2017-04-24 DIAGNOSIS — M9903 Segmental and somatic dysfunction of lumbar region: Secondary | ICD-10-CM | POA: Diagnosis not present

## 2017-04-25 DIAGNOSIS — M9903 Segmental and somatic dysfunction of lumbar region: Secondary | ICD-10-CM | POA: Diagnosis not present

## 2017-04-25 DIAGNOSIS — M9901 Segmental and somatic dysfunction of cervical region: Secondary | ICD-10-CM | POA: Diagnosis not present

## 2017-04-25 DIAGNOSIS — M5441 Lumbago with sciatica, right side: Secondary | ICD-10-CM | POA: Diagnosis not present

## 2017-04-25 DIAGNOSIS — M542 Cervicalgia: Secondary | ICD-10-CM | POA: Diagnosis not present

## 2017-05-02 DIAGNOSIS — M5441 Lumbago with sciatica, right side: Secondary | ICD-10-CM | POA: Diagnosis not present

## 2017-05-02 DIAGNOSIS — M9903 Segmental and somatic dysfunction of lumbar region: Secondary | ICD-10-CM | POA: Diagnosis not present

## 2017-05-02 DIAGNOSIS — M9901 Segmental and somatic dysfunction of cervical region: Secondary | ICD-10-CM | POA: Diagnosis not present

## 2017-05-02 DIAGNOSIS — M542 Cervicalgia: Secondary | ICD-10-CM | POA: Diagnosis not present

## 2017-06-06 DIAGNOSIS — M5416 Radiculopathy, lumbar region: Secondary | ICD-10-CM | POA: Diagnosis not present

## 2017-06-06 DIAGNOSIS — M545 Low back pain: Secondary | ICD-10-CM | POA: Diagnosis not present

## 2017-08-20 DIAGNOSIS — Z1389 Encounter for screening for other disorder: Secondary | ICD-10-CM | POA: Diagnosis not present

## 2017-08-20 DIAGNOSIS — E6609 Other obesity due to excess calories: Secondary | ICD-10-CM | POA: Diagnosis not present

## 2017-08-20 DIAGNOSIS — R6889 Other general symptoms and signs: Secondary | ICD-10-CM | POA: Diagnosis not present

## 2017-08-20 DIAGNOSIS — F330 Major depressive disorder, recurrent, mild: Secondary | ICD-10-CM | POA: Diagnosis not present

## 2017-08-20 DIAGNOSIS — B349 Viral infection, unspecified: Secondary | ICD-10-CM | POA: Diagnosis not present

## 2017-08-20 DIAGNOSIS — J069 Acute upper respiratory infection, unspecified: Secondary | ICD-10-CM | POA: Diagnosis not present

## 2017-09-26 DIAGNOSIS — L400 Psoriasis vulgaris: Secondary | ICD-10-CM | POA: Diagnosis not present

## 2017-09-30 ENCOUNTER — Other Ambulatory Visit: Payer: Self-pay | Admitting: Obstetrics and Gynecology

## 2017-09-30 ENCOUNTER — Other Ambulatory Visit: Payer: Self-pay

## 2017-09-30 DIAGNOSIS — N649 Disorder of breast, unspecified: Secondary | ICD-10-CM

## 2017-09-30 DIAGNOSIS — Z139 Encounter for screening, unspecified: Secondary | ICD-10-CM

## 2017-10-02 ENCOUNTER — Other Ambulatory Visit: Payer: Self-pay | Admitting: Orthopedic Surgery

## 2017-10-02 DIAGNOSIS — M5416 Radiculopathy, lumbar region: Secondary | ICD-10-CM

## 2017-10-03 ENCOUNTER — Ambulatory Visit
Admission: RE | Admit: 2017-10-03 | Discharge: 2017-10-03 | Disposition: A | Discharge status: Home / Self Care | Payer: BLUE CROSS/BLUE SHIELD | Source: Ambulatory Visit | Attending: Obstetrics and Gynecology | Admitting: Obstetrics and Gynecology

## 2017-10-03 DIAGNOSIS — R921 Mammographic calcification found on diagnostic imaging of breast: Secondary | ICD-10-CM | POA: Diagnosis not present

## 2017-10-03 DIAGNOSIS — N649 Disorder of breast, unspecified: Secondary | ICD-10-CM

## 2017-10-06 ENCOUNTER — Ambulatory Visit
Admission: RE | Admit: 2017-10-06 | Discharge: 2017-10-06 | Disposition: A | Discharge status: Home / Self Care | Payer: BLUE CROSS/BLUE SHIELD | Source: Ambulatory Visit | Attending: Orthopedic Surgery | Admitting: Orthopedic Surgery

## 2017-10-06 DIAGNOSIS — M5416 Radiculopathy, lumbar region: Secondary | ICD-10-CM

## 2017-10-06 DIAGNOSIS — R2242 Localized swelling, mass and lump, left lower limb: Secondary | ICD-10-CM | POA: Diagnosis not present

## 2017-10-14 DIAGNOSIS — M533 Sacrococcygeal disorders, not elsewhere classified: Secondary | ICD-10-CM | POA: Diagnosis not present

## 2017-10-16 DIAGNOSIS — F341 Dysthymic disorder: Secondary | ICD-10-CM | POA: Diagnosis not present

## 2017-10-23 ENCOUNTER — Encounter (HOSPITAL_COMMUNITY): Payer: Self-pay | Admitting: Physical Therapy

## 2017-10-23 ENCOUNTER — Ambulatory Visit (HOSPITAL_COMMUNITY): Payer: BLUE CROSS/BLUE SHIELD | Attending: Orthopedic Surgery | Admitting: Physical Therapy

## 2017-10-23 ENCOUNTER — Other Ambulatory Visit: Payer: Self-pay

## 2017-10-23 DIAGNOSIS — M6281 Muscle weakness (generalized): Secondary | ICD-10-CM | POA: Insufficient documentation

## 2017-10-23 DIAGNOSIS — M5416 Radiculopathy, lumbar region: Secondary | ICD-10-CM | POA: Diagnosis not present

## 2017-10-23 NOTE — Patient Instructions (Addendum)
Isometric Abdominal    Lying on back with knees bent, tighten stomach by pressing elbows down. Hold __5__ seconds. Repeat _10___ times per set. Do __1__ sets per session. Do __3__ sessions per day.  http://orth.exer.us/1086   Copyright  VHI. All rights reserved.  Hamstring Stretch: Active    Support behind right knee. Starting with knee bent, attempt to straighten knee until a comfortable stretch is felt in back of thigh. Hold ___20_ seconds.  Repeat to left  Repeat __3__ times per set. Do _1___ sets per session. Do _2___ sessions per day.  http://orth.exer.us/158   Copyright  VHI. All rights reserved.  Bridging    Slowly raise buttocks from floor, keeping stomach tight. Repeat __10__ times per set. Do __1__ sets per session. Do __2__ sessions per day.  http://orth.exer.us/1096   Copyright  VHI. All rights reserved.  Strengthening: Hip Adduction - Isometric    With ball or folded pillow between knees, squeeze knees together. Hold ___5_ seconds. Repeat _10___ times per set. Do __1__ sets per session. Do _2___ sessions per day.  http://orth.exer.us/612   Copyright  VHI. All rights reserved.

## 2017-10-23 NOTE — Therapy (Signed)
[facility] [address] [place], [place], [address] Phone: [phone]   Fax:  [phone]  Physical Therapy Evaluation  Patient Details  Name: Kristin Austin MRN: [id number] Date of Birth: July 06, 1965 Referring Provider: Estill Bamberg   Encounter Date: 10/23/2017  PT End of Session - 10/23/17 1407    Visit Number  1    Number of Visits  12    Date for PT Re-Evaluation  11/22/17    Authorization Type  [employer] with 30 visit limit     Authorization - Visit Number  1    Authorization - Number of Visits  30    PT Start Time  1315    PT Stop Time  1400    PT Time Calculation (min)  45 min    Activity Tolerance  Patient tolerated treatment well    Behavior During Therapy  [facility] for tasks assessed/performed       Past Medical History:  Diagnosis Date  . Allergic rhinitis   . Asthma   . Depression   . OSA on CPAP     History reviewed. No pertinent surgical history.  There were no vitals filed for this visit.   Subjective Assessment - 10/23/17 1320    Subjective  [name] that she has had progressive back pain for the past six months.  She has Rt sided pain that goes to the knee level.  The pain is constant but the intensitiy varies.  She went to a chiropractor for 2 treatments but feels that the pain got worse rather than better.  She had an MRI which shows minor protrusions that are not impinging on anything.  She has used lidocaine patches which have helped her pain.  She is now being referred to skilled physcial therapy.     How long can you sit comfortably?  30 minutes     How long can you stand comfortably?  pain is not as bad standing     How long can you walk comfortably?  helps     Currently in Pain?  Yes    Pain Score  3  goes as high as a 10/10    Pain Location  Back    Pain Orientation  Right    Pain Descriptors / Indicators  Aching;Discomfort    Pain Onset  More than a month ago    Pain Frequency  Constant    Aggravating Factors   sitting     Pain Relieving Factors  pain patch    Effect of Pain on Daily Activities  limits          [facility] PT Assessment - 10/23/17 0001      Assessment   Medical Diagnosis  SI dysfunction/ back pain     Referring Provider  Estill Bamberg    Onset Date/Surgical Date  09/29/17 acute exacerbation     Next MD Visit  not scheduled    Prior Therapy  none      Precautions   Precautions  None      Restrictions   Weight Bearing Restrictions  No      Balance Screen   Has the patient fallen in the past 6 months  Yes    How many times?  1    Has the patient had a decrease in activity level because of a fear of falling?   No    Is the patient reluctant to leave their home because of a fear of  falling?   No      Prior Function   Level of Independence  Independent    Vocation  Full time employment    Vocation Requirements  sitting       Cognition   Overall Cognitive Status  Within Functional Limits for tasks assessed      Observation/Other Assessments   Focus on Therapeutic Outcomes (FOTO)   45      Functional Tests   Functional tests  Single leg stance;Sit to Stand      Single Leg Stance   Comments  Rt: 13; Lt 15      Sit to Stand   Comments  5 x 14.18      Posture/Postural Control   Posture/Postural Control  Postural limitations    Postural Limitations  Decreased lumbar lordosis;Increased thoracic kyphosis    Posture Comments  Rt SI anteriorly rotated       ROM / Strength   AROM / PROM / Strength  AROM;Strength      AROM   AROM Assessment Site  Lumbar    Lumbar Flexion  fingers to knees  reps cause no change     Lumbar Extension  40 reps no change       Strength   Strength Assessment Site  Hip    Right/Left Hip  Right;Left    Right Hip Flexion  3/5    Right Hip Extension  3+/5    Right Hip ABduction  3+/5    Left Hip Flexion  4-/5    Left Hip Extension  3+/5    Left Hip ABduction  3+/5      Flexibility   Soft Tissue Assessment /Muscle  Length  yes    Hamstrings  LT 145 ; Rtr 135                 Objective measurements completed on examination: See above findings.      [facility] Adult PT Treatment/Exercise - 10/23/17 0001      Exercises   Exercises  Lumbar      Lumbar Exercises: Stretches   Active Hamstring Stretch  Right;Left;2 reps;20 seconds      Lumbar Exercises: Supine   Ab Set  10 reps    Bridge  5 reps    Other Supine Lumbar Exercises  isometric hip adduction x 10       Manual Therapy   Manual Therapy  Joint mobilization    Manual therapy comments  done seperate from all other aspects of treatment     Joint Mobilization  Posterior rotation of Rt SI to correct anterior rotation.             PT Education - 10/23/17 1406    Education provided  Yes    Education Details  HEP; importance of posture.  PT stands with shoulders behind base of support     Person(s) Educated  Patient    Methods  Explanation    Comprehension  Verbalized understanding;Returned demonstration       PT Short Term Goals - 10/23/17 1416      PT SHORT TERM GOAL #1   Title  Pt bac and right leg pain to be no greater than a 5/10  to be able to sit in comfort for an hour to complete work activites     Time  3    Period  Weeks    Status  New    Target Date  11/13/17      PT  SHORT TERM GOAL #2   Title  PT to have no radicular symptoms in her right leg to demonstrate decreased nerve irritation.    Time  3    Period  Weeks    Status  New      PT SHORT TERM GOAL #3   Title  Pt SI  joint to be in proper alignment without adjustment for the past week.     Time  3    Period  Weeks    Status  New        PT Long Term Goals - 10/23/17 1419      PT LONG TERM GOAL #1   Title  PT back  pain to be no greater than a 2/10 to allow pt to sit in comfort for up to two hours.    Time  6    Period  Weeks    Status  New    Target Date  12/04/17      PT LONG TERM GOAL #2   Title  PT LE and core strength to be increased by  one grade to allow pt to go up and down 10 steps without any increased pain.     Time  6    Period  Weeks    Status  New      PT LONG TERM GOAL #3   Title  PT to be ambulating without a trendelenburg gait pattern.     Time  6    Period  Weeks    Status  New             Plan - 10/23/17 1408    Clinical Impression Statement  Ms. Cotten is a 52 yo female who has been having Rt radicular low back pain for the past six months.  Chiropractic treatments were unsuccessful.  She has now been referred to skilled physical therapy.  Examination demonstrates abnormal gait,  her Rt SI to be anteriorly rotated, weak core and LE mm strength, decreased hamstring flexibility, postural dysfunction and increased pain.  Ms. Dellis will benefit from skilled physical therapy to address these issues and maximize her functional ability.     Clinical Presentation  Stable    Clinical Decision Making  Moderate    Rehab Potential  Good    PT Frequency  2x / week    PT Duration  6 weeks    PT Treatment/Interventions  Ultrasound;Therapeutic activities;Therapeutic exercise;Balance training;Cognitive remediation;Patient/family education;Manual techniques;Gait training;Stair training;Functional mobility training    PT Next Visit Plan  check SI and adjust as needed prior to exercises.  Begin sidelying clam shell and abduction and prone hip extension as well as standing heel raises and functional squats.  Pay attention to standing posture and correct if needed.  Progress through stabilization .    PT Home Exercise Plan  hip adduction isometric, bridge, abdominal isometric and hamstring stretch.     Consulted and Agree with Plan of Care  Patient       Patient will benefit from skilled therapeutic intervention in order to improve the following deficits and impairments:  Abnormal gait, Decreased balance, Decreased strength, Increased fascial restricitons, Impaired flexibility, Pain, Postural dysfunction, Obesity  Visit  Diagnosis: Radiculopathy, lumbar region - Plan: PT plan of care cert/re-cert  Muscle weakness (generalized) - Plan: PT plan of care cert/re-cert     Problem List There are no active problems to display for this patient.   Virgina Organ, PT CLT [phone] 10/23/2017, 2:26 PM  [facility] [address] [place], [place], [address] Phone: [phone]   Fax:  [phone]  Name: Kristin Austin MRN: [id number] Date of Birth: 1965-09-17

## 2017-10-27 ENCOUNTER — Telehealth (HOSPITAL_COMMUNITY): Payer: Self-pay | Admitting: Physical Therapy

## 2017-10-27 DIAGNOSIS — F341 Dysthymic disorder: Secondary | ICD-10-CM | POA: Diagnosis not present

## 2017-10-27 NOTE — Telephone Encounter (Signed)
Requested to be d/c due to lack of finances. She was very happy with our services. NF 10/27/17

## 2017-10-27 NOTE — Telephone Encounter (Signed)
Pt changed her mind she wants to be put on hold and NOT D/C. She will call us back in two weeks to let us know what she has worked out about her insurance. NF 10/27/17

## 2017-10-28 ENCOUNTER — Encounter: Payer: Self-pay | Admitting: General Surgery

## 2017-10-28 ENCOUNTER — Ambulatory Visit: Payer: BLUE CROSS/BLUE SHIELD | Admitting: General Surgery

## 2017-10-28 ENCOUNTER — Encounter (HOSPITAL_COMMUNITY): Payer: BLUE CROSS/BLUE SHIELD | Admitting: Physical Therapy

## 2017-10-28 VITALS — BP 142/75 | HR 79 | Temp 97.7°F | Ht 63.0 in | Wt 206.0 lb

## 2017-10-28 DIAGNOSIS — R2242 Localized swelling, mass and lump, left lower limb: Secondary | ICD-10-CM | POA: Diagnosis not present

## 2017-10-28 NOTE — Progress Notes (Signed)
Kristin Austin; [id number]; [birth year]   HPI Patient is a 52 year old white female who was referred to my care by Dr. Wende Crease for evaluation and treatment of a mass on her left thigh.  She states she felt it approximately 1 month ago.  No drainage has been noted.  It was tender to touch.  She was prescribed an antibiotic, but did not take it.  It has since resolved and she has a difficult time finding it.  She states the area had a pain level of 2 out of 10.  She denies any fevers, chills, or trauma to the left leg. Past Medical History:  Diagnosis Date  . Allergic rhinitis   . Asthma   . Depression   . OSA on CPAP     History reviewed. No pertinent surgical history.  Family History  Problem Relation Age of Onset  . Cancer Mother   . Diabetes Mellitus II Mother   . Prostate cancer Father   . Diabetes Mellitus II Father   . Hypertension Sister     Current Outpatient Medications on File Prior to Visit  Medication Sig Dispense Refill  . buPROPion (WELLBUTRIN XL) 150 MG 24 hr tablet Take 150 mg by mouth daily.   5  . cetirizine (ZYRTEC) 10 MG tablet Take 10 mg by mouth daily.    . fenofibrate 160 MG tablet Take 160 mg by mouth daily.   5  . ibuprofen (ADVIL,MOTRIN) 800 MG tablet Take 1 tablet (800 mg total) by mouth 3 (three) times daily. 21 tablet 0   No current facility-administered medications on file prior to visit.     Allergies  Allergen Reactions  . Codeine Nausea And Vomiting  . Penicillins Rash    Social History   Substance and Sexual Activity  Alcohol Use No    Social History   Tobacco Use  Smoking Status Current Every Day Smoker  . Packs/day: 1.00  . Years: 20.00  . Pack years: 20.00  . Types: Cigarettes  Smokeless Tobacco Never Used    Review of Systems  Constitutional: Negative.   HENT: Positive for sinus pain.   Eyes: Negative.   Respiratory: Positive for cough and shortness of breath.   Cardiovascular: Negative.   Gastrointestinal: Negative.    Genitourinary: Negative.   Musculoskeletal: Positive for back pain and neck pain.  Skin: Positive for itching and rash.  Neurological: Positive for headaches.  Endo/Heme/Allergies: Negative.   Psychiatric/Behavioral: Negative.     Objective   Vitals:   10/28/17 1011  BP: (!) 142/75  Pulse: 79  Temp: 97.7 F (36.5 C)    Physical Exam  Constitutional: She is oriented to person, place, and time. She appears well-developed and well-nourished.  HENT:  Head: Normocephalic and atraumatic.  Cardiovascular: Normal rate, regular rhythm and normal heart sounds. Exam reveals no gallop and no friction rub.  No murmur heard. Pulmonary/Chest: Effort normal and breath sounds normal. No stridor. No respiratory distress. She has no wheezes. She has no rales.  Musculoskeletal:  No dominant mass could be found in the area of concern in the left thigh.  No overlying erythema, rash, or punctum present.  Neurological: She is alert and oriented to person, place, and time.  Skin: Skin is warm and dry.  Vitals reviewed. In the left thigh.  Assessment  Mass, left leg, resolved Plan   Patient may have had a cyst were bruised and has since resolved.  Should it recur, she was instructed to call my office.  Follow-up as needed.

## 2017-10-28 NOTE — Patient Instructions (Signed)
Lipoma A lipoma is a noncancerous (benign) tumor that is made up of fat cells. This is a very common type of soft-tissue growth. Lipomas are usually found under the skin (subcutaneous). They may occur in any tissue of the body that contains fat. Common areas for lipomas to appear include the back, shoulders, buttocks, and thighs. Lipomas grow slowly, and they are usually painless. Most lipomas do not cause problems and do not require treatment. What are the causes? The cause of this condition is not known. What increases the risk? This condition is more likely to develop in:  People who are 40-60 years old.  People who have a family history of lipomas.  What are the signs or symptoms? A lipoma usually appears as a small, round bump under the skin. It may feel soft or rubbery, but the firmness can vary. Most lipomas are not painful. However, a lipoma may become painful if it is located in an area where it pushes on nerves. How is this diagnosed? A lipoma can usually be diagnosed with a physical exam. You may also have tests to confirm the diagnosis and to rule out other conditions. Tests may include:  Imaging tests, such as a CT scan or MRI.  Removal of a tissue sample to be looked at under a microscope (biopsy).  How is this treated? Treatment is not needed for small lipomas that are not causing problems. If a lipoma continues to get bigger or it causes problems, removal is often the best option. Lipomas can also be removed to improve appearance. Removal of a lipoma is usually done with a surgery in which the fatty cells and the surrounding capsule are removed. Most often, a medicine that numbs the area (local anesthetic) is used for this procedure. Follow these instructions at home:  Keep all follow-up visits as directed by your health care provider. This is important. Contact a health care provider if:  Your lipoma becomes larger or hard.  Your lipoma becomes painful, red, or  increasingly swollen. These could be signs of infection or a more serious condition. This information is not intended to replace advice given to you by your health care provider. Make sure you discuss any questions you have with your health care provider. Document Released: 06/07/2002 Document Revised: 11/23/2015 Document Reviewed: 06/13/2014 Elsevier Interactive Patient Education  2018 Elsevier Inc.  

## 2017-11-12 DIAGNOSIS — F341 Dysthymic disorder: Secondary | ICD-10-CM | POA: Diagnosis not present

## 2017-11-26 DIAGNOSIS — F341 Dysthymic disorder: Secondary | ICD-10-CM | POA: Diagnosis not present

## 2017-12-11 DIAGNOSIS — F341 Dysthymic disorder: Secondary | ICD-10-CM | POA: Diagnosis not present

## 2017-12-15 DIAGNOSIS — F341 Dysthymic disorder: Secondary | ICD-10-CM | POA: Diagnosis not present

## 2017-12-24 ENCOUNTER — Encounter (HOSPITAL_COMMUNITY): Payer: Self-pay | Admitting: Physical Therapy

## 2017-12-24 NOTE — Therapy (Signed)
[facility] [place], [place], [address] Phone: [phone]   Fax:  [phone]  Patient Details  Name: Kristin Austin MRN: [id number] Date of Birth: 1965-07-29 Referring Provider:  No ref. provider found  Encounter Date: 12/24/2017  PHYSICAL THERAPY DISCHARGE SUMMARY  Visits from Start of Care: 1  Current functional level related to goals / functional outcomes: No change Remaining deficits: unknown   Education / Equipment: HEP Plan: Patient agrees to discharge.  Patient goals were not met. Patient is being discharged due to not returning since the last visit.  ?????       Rayetta Humphrey, PT CLT [phone] 12/24/2017, 12:01 PM  [facility] [address] [place], [place], [address] Phone: [phone]   Fax:  [phone]

## 2017-12-25 DIAGNOSIS — F341 Dysthymic disorder: Secondary | ICD-10-CM | POA: Diagnosis not present

## 2017-12-30 DIAGNOSIS — F321 Major depressive disorder, single episode, moderate: Secondary | ICD-10-CM | POA: Diagnosis not present

## 2017-12-30 DIAGNOSIS — Z1389 Encounter for screening for other disorder: Secondary | ICD-10-CM | POA: Diagnosis not present

## 2017-12-30 DIAGNOSIS — Z6836 Body mass index (BMI) 36.0-36.9, adult: Secondary | ICD-10-CM | POA: Diagnosis not present

## 2017-12-30 DIAGNOSIS — E6609 Other obesity due to excess calories: Secondary | ICD-10-CM | POA: Diagnosis not present

## 2018-01-12 DIAGNOSIS — Z01419 Encounter for gynecological examination (general) (routine) without abnormal findings: Secondary | ICD-10-CM | POA: Diagnosis not present

## 2018-01-12 DIAGNOSIS — Z13820 Encounter for screening for osteoporosis: Secondary | ICD-10-CM | POA: Diagnosis not present

## 2018-01-12 DIAGNOSIS — Z6835 Body mass index (BMI) 35.0-35.9, adult: Secondary | ICD-10-CM | POA: Diagnosis not present

## 2018-01-19 DIAGNOSIS — J329 Chronic sinusitis, unspecified: Secondary | ICD-10-CM | POA: Diagnosis not present

## 2018-01-19 DIAGNOSIS — J069 Acute upper respiratory infection, unspecified: Secondary | ICD-10-CM | POA: Diagnosis not present

## 2018-02-12 DIAGNOSIS — K529 Noninfective gastroenteritis and colitis, unspecified: Secondary | ICD-10-CM | POA: Diagnosis not present

## 2018-02-12 DIAGNOSIS — Z6836 Body mass index (BMI) 36.0-36.9, adult: Secondary | ICD-10-CM | POA: Diagnosis not present

## 2018-02-12 DIAGNOSIS — E6609 Other obesity due to excess calories: Secondary | ICD-10-CM | POA: Diagnosis not present

## 2018-02-12 DIAGNOSIS — Z719 Counseling, unspecified: Secondary | ICD-10-CM | POA: Diagnosis not present

## 2018-02-12 DIAGNOSIS — Z1389 Encounter for screening for other disorder: Secondary | ICD-10-CM | POA: Diagnosis not present

## 2018-02-24 DIAGNOSIS — Z79899 Other long term (current) drug therapy: Secondary | ICD-10-CM | POA: Diagnosis not present

## 2018-02-24 DIAGNOSIS — Z86010 Personal history of colonic polyps: Secondary | ICD-10-CM | POA: Diagnosis not present

## 2018-02-24 DIAGNOSIS — R197 Diarrhea, unspecified: Secondary | ICD-10-CM | POA: Diagnosis not present

## 2018-03-06 DIAGNOSIS — R197 Diarrhea, unspecified: Secondary | ICD-10-CM | POA: Diagnosis not present

## 2018-03-13 DIAGNOSIS — N907 Vulvar cyst: Secondary | ICD-10-CM | POA: Diagnosis not present

## 2018-03-13 DIAGNOSIS — Z8042 Family history of malignant neoplasm of prostate: Secondary | ICD-10-CM | POA: Diagnosis not present

## 2018-03-13 DIAGNOSIS — Z803 Family history of malignant neoplasm of breast: Secondary | ICD-10-CM | POA: Diagnosis not present

## 2018-03-24 DIAGNOSIS — K52832 Lymphocytic colitis: Secondary | ICD-10-CM | POA: Diagnosis not present

## 2018-03-24 DIAGNOSIS — D123 Benign neoplasm of transverse colon: Secondary | ICD-10-CM | POA: Diagnosis not present

## 2018-03-24 DIAGNOSIS — K640 First degree hemorrhoids: Secondary | ICD-10-CM | POA: Diagnosis not present

## 2018-03-24 DIAGNOSIS — Z86010 Personal history of colonic polyps: Secondary | ICD-10-CM | POA: Diagnosis not present

## 2018-03-25 HISTORY — PX: COLONOSCOPY: SHX174

## 2018-03-27 DIAGNOSIS — D123 Benign neoplasm of transverse colon: Secondary | ICD-10-CM | POA: Diagnosis not present

## 2018-03-27 DIAGNOSIS — K52832 Lymphocytic colitis: Secondary | ICD-10-CM | POA: Diagnosis not present

## 2018-04-23 DIAGNOSIS — Z79899 Other long term (current) drug therapy: Secondary | ICD-10-CM | POA: Diagnosis not present

## 2018-04-23 DIAGNOSIS — L409 Psoriasis, unspecified: Secondary | ICD-10-CM | POA: Diagnosis not present

## 2018-04-23 DIAGNOSIS — L400 Psoriasis vulgaris: Secondary | ICD-10-CM | POA: Diagnosis not present

## 2018-05-08 DIAGNOSIS — Z716 Tobacco abuse counseling: Secondary | ICD-10-CM | POA: Diagnosis not present

## 2018-05-08 DIAGNOSIS — L723 Sebaceous cyst: Secondary | ICD-10-CM | POA: Diagnosis not present

## 2018-05-08 DIAGNOSIS — Z809 Family history of malignant neoplasm, unspecified: Secondary | ICD-10-CM | POA: Diagnosis not present

## 2018-05-21 DIAGNOSIS — R05 Cough: Secondary | ICD-10-CM | POA: Diagnosis not present

## 2018-05-21 DIAGNOSIS — J019 Acute sinusitis, unspecified: Secondary | ICD-10-CM | POA: Diagnosis not present

## 2018-05-21 DIAGNOSIS — Z6836 Body mass index (BMI) 36.0-36.9, adult: Secondary | ICD-10-CM | POA: Diagnosis not present

## 2018-05-21 DIAGNOSIS — Z1389 Encounter for screening for other disorder: Secondary | ICD-10-CM | POA: Diagnosis not present

## 2018-05-21 DIAGNOSIS — E6609 Other obesity due to excess calories: Secondary | ICD-10-CM | POA: Diagnosis not present

## 2018-05-21 DIAGNOSIS — F17200 Nicotine dependence, unspecified, uncomplicated: Secondary | ICD-10-CM | POA: Diagnosis not present

## 2018-05-22 ENCOUNTER — Ambulatory Visit (HOSPITAL_COMMUNITY)
Admission: RE | Admit: 2018-05-22 | Discharge: 2018-05-22 | Disposition: A | Discharge status: Home / Self Care | Payer: BLUE CROSS/BLUE SHIELD | Source: Ambulatory Visit | Attending: Physician Assistant | Admitting: Physician Assistant

## 2018-05-22 ENCOUNTER — Other Ambulatory Visit (HOSPITAL_COMMUNITY): Payer: Self-pay | Admitting: Physician Assistant

## 2018-05-22 DIAGNOSIS — R05 Cough: Secondary | ICD-10-CM | POA: Diagnosis not present

## 2018-05-22 DIAGNOSIS — R059 Cough, unspecified: Secondary | ICD-10-CM

## 2018-06-03 DIAGNOSIS — Z719 Counseling, unspecified: Secondary | ICD-10-CM | POA: Diagnosis not present

## 2018-06-03 DIAGNOSIS — Z1389 Encounter for screening for other disorder: Secondary | ICD-10-CM | POA: Diagnosis not present

## 2018-06-03 DIAGNOSIS — J4530 Mild persistent asthma, uncomplicated: Secondary | ICD-10-CM | POA: Diagnosis not present

## 2018-06-03 DIAGNOSIS — F330 Major depressive disorder, recurrent, mild: Secondary | ICD-10-CM | POA: Diagnosis not present

## 2018-06-03 DIAGNOSIS — Z0001 Encounter for general adult medical examination with abnormal findings: Secondary | ICD-10-CM | POA: Diagnosis not present

## 2018-06-03 DIAGNOSIS — E669 Obesity, unspecified: Secondary | ICD-10-CM | POA: Diagnosis not present

## 2018-06-03 DIAGNOSIS — J22 Unspecified acute lower respiratory infection: Secondary | ICD-10-CM | POA: Diagnosis not present

## 2018-06-03 DIAGNOSIS — Z6836 Body mass index (BMI) 36.0-36.9, adult: Secondary | ICD-10-CM | POA: Diagnosis not present

## 2018-07-16 DIAGNOSIS — G4733 Obstructive sleep apnea (adult) (pediatric): Secondary | ICD-10-CM | POA: Diagnosis not present

## 2018-08-03 DIAGNOSIS — M25561 Pain in right knee: Secondary | ICD-10-CM | POA: Diagnosis not present

## 2018-11-03 DIAGNOSIS — H40013 Open angle with borderline findings, low risk, bilateral: Secondary | ICD-10-CM | POA: Diagnosis not present

## 2018-11-03 DIAGNOSIS — H31091 Other chorioretinal scars, right eye: Secondary | ICD-10-CM | POA: Diagnosis not present

## 2018-11-03 DIAGNOSIS — H1033 Unspecified acute conjunctivitis, bilateral: Secondary | ICD-10-CM | POA: Diagnosis not present

## 2018-11-03 DIAGNOSIS — H16223 Keratoconjunctivitis sicca, not specified as Sjogren's, bilateral: Secondary | ICD-10-CM | POA: Diagnosis not present

## 2018-12-04 DIAGNOSIS — H31091 Other chorioretinal scars, right eye: Secondary | ICD-10-CM | POA: Diagnosis not present

## 2018-12-04 DIAGNOSIS — H16223 Keratoconjunctivitis sicca, not specified as Sjogren's, bilateral: Secondary | ICD-10-CM | POA: Diagnosis not present

## 2018-12-04 DIAGNOSIS — H40013 Open angle with borderline findings, low risk, bilateral: Secondary | ICD-10-CM | POA: Diagnosis not present

## 2019-02-25 DIAGNOSIS — Z719 Counseling, unspecified: Secondary | ICD-10-CM | POA: Diagnosis not present

## 2019-02-25 DIAGNOSIS — R079 Chest pain, unspecified: Secondary | ICD-10-CM | POA: Diagnosis not present

## 2019-02-25 DIAGNOSIS — J4530 Mild persistent asthma, uncomplicated: Secondary | ICD-10-CM | POA: Diagnosis not present

## 2019-02-25 DIAGNOSIS — Z6837 Body mass index (BMI) 37.0-37.9, adult: Secondary | ICD-10-CM | POA: Diagnosis not present

## 2019-02-25 DIAGNOSIS — Z1389 Encounter for screening for other disorder: Secondary | ICD-10-CM | POA: Diagnosis not present

## 2019-02-25 DIAGNOSIS — J45909 Unspecified asthma, uncomplicated: Secondary | ICD-10-CM | POA: Diagnosis not present

## 2019-02-25 DIAGNOSIS — F419 Anxiety disorder, unspecified: Secondary | ICD-10-CM | POA: Diagnosis not present

## 2019-02-25 DIAGNOSIS — K7689 Other specified diseases of liver: Secondary | ICD-10-CM | POA: Diagnosis not present

## 2019-02-25 DIAGNOSIS — F17200 Nicotine dependence, unspecified, uncomplicated: Secondary | ICD-10-CM | POA: Diagnosis not present

## 2019-02-25 DIAGNOSIS — E119 Type 2 diabetes mellitus without complications: Secondary | ICD-10-CM | POA: Diagnosis not present

## 2019-03-18 DIAGNOSIS — R945 Abnormal results of liver function studies: Secondary | ICD-10-CM | POA: Diagnosis not present

## 2019-03-18 DIAGNOSIS — Z6836 Body mass index (BMI) 36.0-36.9, adult: Secondary | ICD-10-CM | POA: Diagnosis not present

## 2019-03-18 DIAGNOSIS — E6609 Other obesity due to excess calories: Secondary | ICD-10-CM | POA: Diagnosis not present

## 2019-03-18 DIAGNOSIS — E1165 Type 2 diabetes mellitus with hyperglycemia: Secondary | ICD-10-CM | POA: Diagnosis not present

## 2019-03-30 ENCOUNTER — Other Ambulatory Visit: Payer: Self-pay

## 2019-03-30 ENCOUNTER — Encounter: Payer: BC Managed Care – PPO | Attending: Family Medicine | Admitting: Nutrition

## 2019-03-30 ENCOUNTER — Encounter: Payer: Self-pay | Admitting: Nutrition

## 2019-03-30 VITALS — Ht 63.0 in | Wt 209.0 lb

## 2019-03-30 DIAGNOSIS — E118 Type 2 diabetes mellitus with unspecified complications: Secondary | ICD-10-CM | POA: Diagnosis not present

## 2019-03-30 DIAGNOSIS — E669 Obesity, unspecified: Secondary | ICD-10-CM | POA: Insufficient documentation

## 2019-03-30 DIAGNOSIS — E1165 Type 2 diabetes mellitus with hyperglycemia: Secondary | ICD-10-CM | POA: Insufficient documentation

## 2019-03-30 DIAGNOSIS — E782 Mixed hyperlipidemia: Secondary | ICD-10-CM | POA: Insufficient documentation

## 2019-03-30 DIAGNOSIS — E781 Pure hyperglyceridemia: Secondary | ICD-10-CM

## 2019-03-30 DIAGNOSIS — IMO0002 Reserved for concepts with insufficient information to code with codable children: Secondary | ICD-10-CM

## 2019-03-30 NOTE — Patient Instructions (Signed)
Goals Follow MY Plate  Cut out fast food  Eat three meals per day  Drink water only Keep a food journal Try Myfitness Pal APP

## 2019-03-30 NOTE — Progress Notes (Signed)
  Medical Nutrition Therapy:  Appt start time: 0800 end time:  0915   Assessment:  Primary concerns today: Diabetes Type2, Obesity, Hyperlipidemia and Possible fatty liver and anxiety. Just diagnosed with Type 2 Dm a month ago.  LIves by herself. She eatst most meals ouit. 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  Smokes. Wants to quit smoking, lose weight and improve her health. She notes she is a picky eater. Eats fast food 3 times per day. Doesn't cook but willing to start preparing meals at home. Has complained of fatigue, increased thirst, hunger, frequent urination and blurry vision. Last A1C 8.5%, down from 9%. Currently on Metformin 500 mg BID. TCHO 230 mg/dl, TG 445 mg/dl and HDL 23 mg/dl. High risk for CVD and complications from DM. Afraid to start testing blood sugars. Will work with her on that. Suppose to start taking WellButrin for anxiety and quitting smoking.   Preferred Learning Style:   Visual   No preference indicated   Learning Readiness:  Ready  Change in progress   MEDICATIONS:   DIETARY INTAKE:   24-hr recall:  B ( AM): Bacon, egg and cheese biscuit Lunch:  Fast food, diet soda, or regular soda Dinner: Pizza 2-3 slices. Tea or soda,   Usual physical activity: none  Estimated energy needs: 1200 calories 135 g carbohydrates 90 g protein 33 g fat  Progress Towards Goal(s):  In progress.   Nutritional Diagnosis:  NB-1.1 Food and nutrition-related knowledge deficit As related to  Dm Type 2 and Hyperlipidemia.  As evidenced by A1C 9% and TCHOL 230 mg/dl and TG 445 mg/dl..    Intervention: Nutrition and Diabetes education provided on My Plate, CHO counting, meal planning, portion sizes, timing of meals, avoiding snacks between meals unless having a low blood sugar, target ranges for A1C and blood sugars, signs/symptoms and treatment of hyper/hypoglycemia, monitoring blood sugars, taking medications as prescribed, benefits of exercising 30 minutes per day  and prevention of complications of DM.  Marland KitchenGoals Follow MY Plate  Cut out fast food  Eat three meals per day  Drink water only Keep a food journal Try Myfitness Pal APP  Teaching Method Utilized:  Visual Auditory Hands on  Handouts given during visit include:  The Plate Method  Meal Plan Card  Hypercholesterolemia diet   Barriers to learning/adherence to lifestyle change: none  Demonstrated degree of understanding via:  Teach Back   Monitoring/Evaluation:  Dietary intake, exercise, ,and body weight in 1 month(s).

## 2019-04-06 ENCOUNTER — Ambulatory Visit: Payer: BC Managed Care – PPO | Admitting: Cardiovascular Disease

## 2019-04-06 ENCOUNTER — Encounter: Payer: Self-pay | Admitting: Cardiovascular Disease

## 2019-04-06 ENCOUNTER — Other Ambulatory Visit: Payer: Self-pay

## 2019-04-06 VITALS — BP 136/80 | HR 78 | Temp 97.3°F | Ht 63.0 in | Wt 207.0 lb

## 2019-04-06 DIAGNOSIS — R079 Chest pain, unspecified: Secondary | ICD-10-CM | POA: Diagnosis not present

## 2019-04-06 DIAGNOSIS — R0602 Shortness of breath: Secondary | ICD-10-CM | POA: Diagnosis not present

## 2019-04-06 NOTE — Patient Instructions (Signed)
Medication Instructions:  Your physician recommends that you continue on your current medications as directed. Please refer to the Current Medication list given to you today.   Labwork: NONE  Testing/Procedures: Your physician has requested that you have en exercise stress myoview. For further information please visit [email or web address]. Please follow instruction sheet, as given.  Your physician has requested that you have an echocardiogram. Echocardiography is a painless test that uses sound waves to create images of your heart. It provides your doctor with information about the size and shape of your heart and how well your heart's chambers and valves are working. This procedure takes approximately one hour. There are no restrictions for this procedure.    Follow-Up: Your physician recommends that you schedule a follow-up appointment in: AS NEEDED    Any Other Special Instructions Will Be Listed Below (If Applicable).     If you need a refill on your cardiac medications before your next appointment, please call your pharmacy.   

## 2019-04-06 NOTE — Progress Notes (Signed)
CARDIOLOGY CONSULT NOTE       Patient ID: Kristin Austin MRN: [id number] DOB/AGE: 1965-07-11 53 y.o.  Referring Physician: Phillips Odor Primary Physician: [name], MD Primary Cardiologist: New Reason for Consultation: Dyspnea  Active Problems:   * No active hospital problems. *   HPI:  53 y.o. with asthma, depression , OSA referred by Dr Phillips Odor for dyspnea.  Been going on for 1-2 months She has had a cough And some wheezing She currently smokes a ppd Her symbicort was increased. Some tightness in chest with her breathing difficulties Her LDL is 133 DM poorly controlled most recently 9.0 She has significant bronchitic COPD and coughs all the time. Has tried Chantix in past for only one week has script for welbutrin but not taking   ROS All other systems reviewed and negative except as noted above  Past Medical History:  Diagnosis Date  . Allergic rhinitis   . Asthma   . Depression   . OSA on CPAP     Family History  Problem Relation Age of Onset  . Cancer Mother   . Diabetes Mellitus II Mother   . Prostate cancer Father   . Diabetes Mellitus II Father   . Hypertension Sister     Social History   Socioeconomic History  . Marital status: Divorced    Spouse name: Not on file  . Number of children: Not on file  . Years of education: Not on file  . Highest education level: Not on file  Occupational History  . Not on file  Social Needs  . Financial resource strain: Not on file  . Food insecurity    Worry: Not on file    Inability: Not on file  . Transportation needs    Medical: Not on file    Non-medical: Not on file  Tobacco Use  . Smoking status: Current Every Day Smoker    Packs/day: 1.00    Years: 20.00    Pack years: 20.00    Types: Cigarettes  . Smokeless tobacco: Never Used  Substance and Sexual Activity  . Alcohol use: No  . Drug use: No  . Sexual activity: Not on file  Lifestyle  . Physical activity    Days per week: Not on file    Minutes per  session: Not on file  . Stress: Not on file  Relationships  . Social Musician on phone: Not on file    Gets together: Not on file    Attends religious service: Not on file    Active member of club or organization: Not on file    Attends meetings of clubs or organizations: Not on file    Relationship status: Not on file  . Intimate partner violence    Fear of current or ex partner: Not on file    Emotionally abused: Not on file    Physically abused: Not on file    Forced sexual activity: Not on file  Other Topics Concern  . Not on file  Social History Narrative  . Not on file    Past Surgical History:  Procedure Laterality Date  . COLONOSCOPY          Physical Exam: Blood pressure 136/80, pulse 78, temperature (!) 97.3 F (36.3 C), temperature source Temporal, height 5\' 3"  (1.6 m), weight 207 lb (93.9 kg), SpO2 98 %.   Affect appropriate Healthy:  appears stated age HEENT: normal Neck supple with no adenopathy JVP normal no bruits no  thyromegaly Lungs clear with no wheezing and good diaphragmatic motion Heart:  S1/S2 no murmur, no rub, gallop or click PMI normal Abdomen: benighn, BS positve, no tenderness, no AAA no bruit.  No HSM or HJR Distal pulses intact with no bruits No edema Neuro non-focal Skin warm and dry No muscular weakness    Radiology: No results found.  EKG: SR lateral T wave changes 02/25/19    ASSESSMENT AND PLAN:    1. Dyspnea:  More likely related to bronchitic COPD echo to assess RV/LV function and PA pressures 2. Chest Tightness:  Related to COPD but with risk factors and ECG changes will order exercise myovue 3  DM:  Discussed low carb diet.  Target hemoglobin A1c is 6.5 or less.  Continue current medications. 4. COPD:  Counseled on smoking cessation less than 10 minutes last CXR 05/22/18 NAD   5. Depression:  Continue wellbutrin XL in light of smoking   Signed: Jenkins Rouge 04/06/2019, 3:16 PM

## 2019-04-13 ENCOUNTER — Other Ambulatory Visit: Payer: Self-pay

## 2019-04-13 DIAGNOSIS — Z20822 Contact with and (suspected) exposure to covid-19: Secondary | ICD-10-CM

## 2019-04-13 DIAGNOSIS — Z20828 Contact with and (suspected) exposure to other viral communicable diseases: Secondary | ICD-10-CM | POA: Diagnosis not present

## 2019-04-15 LAB — NOVEL CORONAVIRUS, NAA: SARS-CoV-2, NAA: NOT DETECTED

## 2019-04-19 ENCOUNTER — Ambulatory Visit: Payer: BLUE CROSS/BLUE SHIELD | Admitting: Cardiology

## 2019-04-21 ENCOUNTER — Ambulatory Visit (HOSPITAL_COMMUNITY)
Admission: RE | Admit: 2019-04-21 | Discharge: 2019-04-21 | Disposition: A | Discharge status: Home / Self Care | Payer: BC Managed Care – PPO | Source: Ambulatory Visit | Attending: Cardiology | Admitting: Cardiology

## 2019-04-21 ENCOUNTER — Encounter (HOSPITAL_COMMUNITY)
Admission: RE | Admit: 2019-04-21 | Discharge: 2019-04-21 | Disposition: A | Discharge status: Home / Self Care | Payer: BC Managed Care – PPO | Source: Ambulatory Visit | Attending: Cardiology | Admitting: Cardiology

## 2019-04-21 ENCOUNTER — Encounter (HOSPITAL_COMMUNITY): Payer: Self-pay

## 2019-04-21 ENCOUNTER — Other Ambulatory Visit: Payer: Self-pay

## 2019-04-21 ENCOUNTER — Ambulatory Visit (HOSPITAL_BASED_OUTPATIENT_CLINIC_OR_DEPARTMENT_OTHER)
Admission: RE | Admit: 2019-04-21 | Discharge: 2019-04-21 | Disposition: A | Discharge status: Home / Self Care | Payer: BC Managed Care – PPO | Source: Ambulatory Visit | Attending: Cardiology | Admitting: Cardiology

## 2019-04-21 DIAGNOSIS — R079 Chest pain, unspecified: Secondary | ICD-10-CM

## 2019-04-21 DIAGNOSIS — R0602 Shortness of breath: Secondary | ICD-10-CM | POA: Insufficient documentation

## 2019-04-21 HISTORY — DX: Type 2 diabetes mellitus without complications: E11.9

## 2019-04-21 LAB — NM MYOCAR MULTI W/SPECT W/WALL MOTION / EF
Estimated workload: 7 METS
Exercise duration (min): 4 min
Exercise duration (sec): 16 s
LV dias vol: 57 mL (ref 46–106)
LV sys vol: 16 mL
MPHR: 166 {beats}/min
Peak HR: 153 {beats}/min
Percent HR: 92 %
RATE: 0.31
RPE: 17
Rest HR: 84 {beats}/min
SDS: 1
SRS: 0
SSS: 1
TID: 1.02

## 2019-04-21 MED ORDER — TECHNETIUM TC 99M TETROFOSMIN IV KIT
30.0000 | PACK | Freq: Once | INTRAVENOUS | Status: AC | PRN
  Administered 2019-04-21: 32 via INTRAVENOUS

## 2019-04-21 MED ORDER — TECHNETIUM TC 99M TETROFOSMIN IV KIT
10.0000 | PACK | Freq: Once | INTRAVENOUS | Status: AC | PRN
  Administered 2019-04-21: 11 via INTRAVENOUS

## 2019-04-21 MED ORDER — REGADENOSON 0.4 MG/5ML IV SOLN
INTRAVENOUS | Status: AC
  Filled 2019-04-21: qty 5

## 2019-04-21 MED ORDER — SODIUM CHLORIDE 0.9% FLUSH
INTRAVENOUS | Status: AC
  Administered 2019-04-21: 10 mL via INTRAVENOUS
  Filled 2019-04-21: qty 10

## 2019-04-21 NOTE — Progress Notes (Signed)
*  PRELIMINARY RESULTS* Echocardiogram 2D Echocardiogram has been performed.  Samuel Germany 04/21/2019, 10:32 AM

## 2019-05-04 ENCOUNTER — Ambulatory Visit (INDEPENDENT_AMBULATORY_CARE_PROVIDER_SITE_OTHER): Payer: BC Managed Care – PPO

## 2019-05-04 ENCOUNTER — Ambulatory Visit: Payer: BC Managed Care – PPO | Admitting: Nutrition

## 2019-05-04 ENCOUNTER — Other Ambulatory Visit: Payer: Self-pay

## 2019-05-04 ENCOUNTER — Encounter: Payer: Self-pay | Admitting: Internal Medicine

## 2019-05-04 ENCOUNTER — Ambulatory Visit: Payer: BC Managed Care – PPO | Admitting: Internal Medicine

## 2019-05-04 DIAGNOSIS — J45901 Unspecified asthma with (acute) exacerbation: Secondary | ICD-10-CM | POA: Insufficient documentation

## 2019-05-04 DIAGNOSIS — J449 Chronic obstructive pulmonary disease, unspecified: Secondary | ICD-10-CM | POA: Diagnosis not present

## 2019-05-04 DIAGNOSIS — F17210 Nicotine dependence, cigarettes, uncomplicated: Secondary | ICD-10-CM

## 2019-05-04 DIAGNOSIS — R05 Cough: Secondary | ICD-10-CM | POA: Diagnosis not present

## 2019-05-04 DIAGNOSIS — J4489 Other specified chronic obstructive pulmonary disease: Secondary | ICD-10-CM

## 2019-05-04 DIAGNOSIS — J45909 Unspecified asthma, uncomplicated: Secondary | ICD-10-CM | POA: Insufficient documentation

## 2019-05-04 LAB — CBC WITH DIFFERENTIAL/PLATELET
Basophils Absolute: 0.1 10*3/uL (ref 0.0–0.1)
Basophils Relative: 0.7 % (ref 0.0–3.0)
Eosinophils Absolute: 0.7 10*3/uL (ref 0.0–0.7)
Eosinophils Relative: 8 % — ABNORMAL HIGH (ref 0.0–5.0)
HCT: 41.1 % (ref 36.0–46.0)
Hemoglobin: 14.1 g/dL (ref 12.0–15.0)
Lymphocytes Relative: 17.1 % (ref 12.0–46.0)
Lymphs Abs: 1.5 10*3/uL (ref 0.7–4.0)
MCHC: 34.3 g/dL (ref 30.0–36.0)
MCV: 85.5 fl (ref 78.0–100.0)
Monocytes Absolute: 0.4 10*3/uL (ref 0.1–1.0)
Monocytes Relative: 4.9 % (ref 3.0–12.0)
Neutro Abs: 6.2 10*3/uL (ref 1.4–7.7)
Neutrophils Relative %: 69.3 % (ref 43.0–77.0)
Platelets: 292 10*3/uL (ref 150.0–400.0)
RBC: 4.81 Mil/uL (ref 3.87–5.11)
RDW: 13.7 % (ref 11.5–15.5)
WBC: 9 10*3/uL (ref 4.0–10.5)

## 2019-05-04 MED ORDER — PANTOPRAZOLE SODIUM 40 MG PO TBEC: 40.0000 mg | DELAYED_RELEASE_TABLET | Freq: Every day | ORAL | 2 refills | Status: DC

## 2019-05-04 MED ORDER — PREDNISONE 10 MG PO TABS: ORAL_TABLET | ORAL | 0 refills | Status: DC

## 2019-05-04 MED ORDER — FAMOTIDINE 20 MG PO TABS: ORAL_TABLET | ORAL | 11 refills | Status: DC

## 2019-05-04 MED ORDER — AZITHROMYCIN 250 MG PO TABS: ORAL_TABLET | ORAL | 0 refills | Status: DC

## 2019-05-04 NOTE — Progress Notes (Signed)
Called and left detailed msg on machine with results

## 2019-05-04 NOTE — Progress Notes (Signed)
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 Some better on saba / symbicort though not adherent and worsening doe since early 2020 so referred to pulmonary clinic 05/04/2019 by Dr   Sharilyn Sites.  In 2015 wt around 220      History of Present Illness  05/04/2019  Pulmonary/ 1st office eval/Kristin Austin  Chief Complaint  Patient presents with  . Pulmonary Consult    Referred by Dr Sharilyn Sites. Pt c/o SOB "for years"- worse over the past several months. She also c/o cough with yellow sputum.  Dyspnea:  MMRC1 = can walk nl pace, flat grade, can't hurry or go uphills or steps s sob   Cough: worse in am's lots yellow mucus  Sleep: on cpap per Dr Hilma Favors  SABA use: has taken po, hfa and neb   No obvious patterns in day to day or daytime variability or assoc    mucus plugs or hemoptysis or cp or chest tightness, subjective wheeze or overt sinus or hb symptoms.   Sleeping as above  without nocturnal    exacerbation  of respiratory  c/o's or need for noct saba. Also denies any obvious fluctuation of symptoms with weather or environmental changes or other aggravating or alleviating factors except as outlined above   No unusual exposure hx or h/o childhood pna/ asthma or knowledge of premature birth.  Current Allergies, Complete Past Medical History, Past Surgical History, Family History, and Social History were reviewed in [facility] record.  ROS  The following are not active complaints unless bolded Hoarseness, sore throat, dysphagia, dental problems, itching, sneezing,  nasal congestion or discharge of excess mucus or purulent secretions, ear ache,   fever, chills, sweats, unintended wt loss or wt gain, classically pleuritic or exertional cp,  orthopnea pnd or arm/hand swelling  or leg swelling,  presyncope, palpitations, abdominal pain, anorexia, nausea, vomiting, diarrhea  or change in bowel habits or change in bladder habits, change in stools or change in urine, dysuria, hematuria,  rash, arthralgias, visual complaints, headache, numbness, weakness or ataxia or problems with walking or coordination,  change in mood or  memory.           Past Medical History:  Diagnosis Date  . Allergic rhinitis   . Asthma   . Depression   . Diabetes mellitus without complication ([place])   . OSA on CPAP     Outpatient Medications Prior to Visit  Medication Sig Dispense Refill  . albuterol (ACCUNEB) 1.25 MG/3ML nebulizer solution Take 1 ampule by nebulization every 4 (four) hours as needed for wheezing.    [name] albuterol (VENTOLIN HFA) 108 (90 Base) MCG/ACT inhaler INHALE 1 TO 2 PUFFS INTO LUNGS EVERY 4 HOURS AS NEEDED    . fenofibrate 160 MG tablet Take 160 mg by mouth daily.   5  . ibuprofen (ADVIL,MOTRIN) 800 MG tablet Take 1 tablet (800 mg total) by mouth 3 (three) times daily. 21 tablet 0  . metFORMIN (GLUCOPHAGE) 500 MG tablet Take 500 mg by mouth 2 (two) times daily with a meal.    . SYMBICORT 80-4.5 MCG/ACT inhaler Inhale 2 puffs into the lungs 2 (two) times daily.    [name] buPROPion (WELLBUTRIN XL) 150 MG 24 hr tablet Take 150 mg by mouth daily.   5  Objective:     BP 130/74 (BP Location: Left Arm, Cuff Size: Large)   Pulse 85   Temp (!) 97 F (36.1 C) (Temporal)   Ht 5\' 3"  (1.6 m)   Wt 206 lb (93.4 kg)   SpO2 97% Comment: on RA  BMI 36.49 kg/m   SpO2: 97 %(on RA)   amb wf mod obese by BMI   HEENT : pt wearing mask not removed for exam due to covid - 19 concerns.    NECK :  without JVD/Nodes/TM/ nl carotid upstrokes bilaterally   LUNGS: no acc muscle use,  Mild barrel  contour chest wall with bilateral  Distant bs s audible wheeze and  without cough on insp or exp maneuvers  and mild  Hyperresonant  to  percussion bilaterally     CV:  RRR  no s3 or murmur or increase  in P2, and no edema   ABD:  soft and nontender with pos end  insp Hoover's  in the supine position. No bruits or organomegaly appreciated, bowel sounds nl  MS:   Nl gait/  ext warm without deformities, calf tenderness, cyanosis or clubbing No obvious joint restrictions   SKIN: warm and dry without lesions    NEURO:  alert, approp, nl sensorium with  no motor or cerebellar deficits apparent.         CXR PA and Lateral:   05/04/2019 :    I personally reviewed images and agree with radiology impression as follows:    Mild peribronchial cuffing noted consistent with patient's history of bronchitis. Low lung volumes. No focal alveolar infiltrate.  Labs ordered 05/04/2019  :  IgE  alpha one AT phenotype    Lab Results  Component Value Date   WBC 9.0 05/04/2019   HGB 14.1 05/04/2019   HCT 41.1 05/04/2019   MCV 85.5 05/04/2019   PLT 292.0 05/04/2019       EOS                                                              0.7                                     05/04/2019       Assessment   Asthmatic bronchitis , chronic (HCC) Active smoker  - alpha one AT screen 05/04/2019  - Eos 0.7 05/04/2019  - 05/04/2019  After extensive coaching inhaler device,  effectiveness =    75% > continue symb 80 2bid but take it more consistently   DDX of  difficult airways management almost all start with A and  include Adherence, Ace Inhibitors, Acid Reflux, Active Sinus Disease, Alpha 1 Antitripsin deficiency, Anxiety masquerading as Airways dz,  ABPA,  Allergy(esp in young), Aspiration (esp in elderly), Adverse effects of meds,  Active smoking or vaping, A bunch of PE's (a small clot burden can't cause this syndrome unless there is already severe underlying pulm or vascular dz with poor reserve) plus two Bs  = Bronchiectasis and Beta blocker use..and one C= CHF  Adherence is always the initial "prime suspect" and is a multilayered concern that requires a "trust but verify" approach in every patient -  starting  with knowing how to use medications, especially inhalers, correctly, keeping up with refills and understanding the fundamental difference between maintenance and prns vs those medications only taken for a very short course and then stopped and not refilled.  - see hfa teaching - return with all meds in hand using a trust but verify approach to confirm accurate Medication  Reconciliation The principal here is that until we are certain that the  patients are doing what we've asked, it makes no sense to ask them to do more.   Active smoking also top of the list > see sep a/p  ? Acid (or non-acid) GERD > always difficult to exclude as up to 75% of pts in some series report no assoc GI/ Heartburn symptoms> rec max (24h)  acid suppression and diet restrictions/ reviewed and instructions given in writing.   ? Allergy with eos 0.7 >  Prednisone 10 mg take  4 each am x 2 days,   2 each am x 2 days,  1 each am x 2 days and stop but hold symb at 80 until return  ? Active sinus dz/ doubt >rx zpak only    >>>  F/u in 4 weeks to regroup.   Cigarette smoker Counseled re importance of smoking cessation but did not meet time criteria for separate billing       Total time devoted to counseling  > 50 % of initial 60 min office visit:  reviewed case with pt/  performed device teaching  using a teach back technique which also  extended face to face time for this visit (see above) discussion of options/alternatives/ personally creating written customized instructions  in presence of pt  then going over those specific  Instructions directly with the pt including how to use all of the meds but in particular covering each new medication in detail and the difference between the maintenance= "automatic" meds and the prns using an action plan format for the latter (If this problem/symptom => do that organization reading Left to right).  Please see AVS from this visit for a full list of these instructions which I  personally wrote for this pt and  are unique to this visit.      Sandrea HughsMichael Melchor Kirchgessner, MD 05/04/2019

## 2019-05-04 NOTE — Patient Instructions (Addendum)
Plan A = Automatic = Always=    Symbicort 80 Take 2 puffs first thing in am and then another 2 puffs about 12 hours later.    Work on inhaler technique:  relax and gently blow all the way out then take a nice smooth deep breath back in, triggering the inhaler at same time you start breathing in.  Hold for up to 5 seconds if you can. Blow symbicort out thru nose. Rinse and gargle with water when done. Brush teeth, tongue, and gargle spit (baking soda toothpaste best choice)   Prednisone 10 mg take  4 each am x 2 days,   2 each am x 2 days,  1 each am x 2 days and stop   zpak  Work on inhaler technique:  relax and gently blow all the way out then take a nice smooth deep breath back in, triggering the inhaler at same time you start breathing in.  Hold for up to 5 seconds if you can. Blow out thru nose. Rinse and gargle with water when done      Plan B = Backup (to supplement plan A, not to replace it) Only use your albuterol inhaler as a rescue medication to be used if you can't catch your breath by resting or doing a relaxed purse lip breathing pattern.  - The less you use it, the better it will work when you need it. - Ok to use the inhaler up to 2 puffs  every 4 hours if you must but call for appointment if use goes up over your usual need - Don't leave home without it !!  (think of it like the spare tire for your car)   Plan C = Crisis (instead of Plan B but only if Plan B stops working) - only use your albuterol nebulizer if you first try Plan B and it fails to help > ok to use the nebulizer up to every 4 hours but if start needing it regularly call for immediate appointment  Pantoprazole (protonix) 40 mg   Take  30-60 min before first meal of the day and Pepcid (famotidine)  20 mg one @  bedtime until return to office - this is the best way to tell whether stomach acid is contributing to your problem.    GERD (REFLUX)  is an extremely common cause of respiratory symptoms just like yours ,  many times with no obvious heartburn at all.    It can be treated with medication, but also with lifestyle changes including elevation of the head of your bed (ideally with 6 -8inch blocks under the headboard of your bed),  Smoking cessation, avoidance of late meals, excessive alcohol, and avoid fatty foods, chocolate, peppermint, colas, red wine, and acidic juices such as orange juice.  NO MINT OR MENTHOL PRODUCTS SO NO COUGH DROPS  USE SUGARLESS CANDY INSTEAD (Jolley ranchers or Stover's or Life Savers) or even ice chips will also do - the key is to swallow to prevent all throat clearing. NO OIL BASED VITAMINS - use powdered substitutes.  Avoid fish oil when coughing.   The key is to stop smoking completely before smoking completely stops you!     Please remember to go to the lab and x-ray department   for your tests - we will call you with the results when they are available.     Please schedule a follow up office visit in 4 weeks, sooner if needed

## 2019-05-05 ENCOUNTER — Encounter: Payer: Self-pay | Admitting: Internal Medicine

## 2019-05-05 DIAGNOSIS — F17200 Nicotine dependence, unspecified, uncomplicated: Secondary | ICD-10-CM | POA: Insufficient documentation

## 2019-05-05 DIAGNOSIS — F17210 Nicotine dependence, cigarettes, uncomplicated: Secondary | ICD-10-CM | POA: Insufficient documentation

## 2019-05-05 NOTE — Assessment & Plan Note (Signed)
Counseled re importance of smoking cessation but did not meet time criteria for separate billing      Total time devoted to counseling  > 50 % of initial 60 min office visit:  reviewed case with pt/ performed device teaching  using a teach back technique which also  extended face to face time for this visit (see above)  discussion of options/alternatives/ personally creating written customized instructions  in presence of pt  then going over those specific  Instructions directly with the pt including how to use all of the meds but in particular covering each new medication in detail and the difference between the maintenance= "automatic" meds and the prns using an action plan format for the latter (If this problem/symptom => do that organization reading Left to right).  Please see AVS from this visit for a full list of these instructions which I personally wrote for this pt and  are unique to this visit.  

## 2019-05-05 NOTE — Assessment & Plan Note (Signed)
Active smoker  - alpha one AT screen 05/04/2019  - Eos 0.7 05/04/2019  - 05/04/2019  After extensive coaching inhaler device,  effectiveness =    75% > continue symb 80 2bid but take it more consistently   DDX of  difficult airways management almost all start with A and  include Adherence, Ace Inhibitors, Acid Reflux, Active Sinus Disease, Alpha 1 Antitripsin deficiency, Anxiety masquerading as Airways dz,  ABPA,  Allergy(esp in young), Aspiration (esp in elderly), Adverse effects of meds,  Active smoking or vaping, A bunch of PE's (a small clot burden can't cause this syndrome unless there is already severe underlying pulm or vascular dz with poor reserve) plus two Bs  = Bronchiectasis and Beta blocker use..and one C= CHF  Adherence is always the initial "prime suspect" and is a multilayered concern that requires a "trust but verify" approach in every patient - starting with knowing how to use medications, especially inhalers, correctly, keeping up with refills and understanding the fundamental difference between maintenance and prns vs those medications only taken for a very short course and then stopped and not refilled.  - see hfa teaching - return with all meds in hand using a trust but verify approach to confirm accurate Medication  Reconciliation The principal here is that until we are certain that the  patients are doing what we've asked, it makes no sense to ask them to do more.   Active smoking also top of the list > see sep a/p  ? Acid (or non-acid) GERD > always difficult to exclude as up to 75% of pts in some series report no assoc GI/ Heartburn symptoms> rec max (24h)  acid suppression and diet restrictions/ reviewed and instructions given in writing.   ? Allergy with eos 0.7 >  Prednisone 10 mg take  4 each am x 2 days,   2 each am x 2 days,  1 each am x 2 days and stop but hold symb at 80 until return  ? Active sinus dz/ doubt >rx zpak only    >>>  F/u in 4 weeks to regroup.

## 2019-05-08 LAB — IGE: IgE (Immunoglobulin E), Serum: 347 kU/L — ABNORMAL HIGH (ref <=114)

## 2019-05-08 LAB — ALPHA-1 ANTITRYPSIN PHENOTYPE: A-1 Antitrypsin, Ser: 178 mg/dL (ref 83–199)

## 2019-05-10 NOTE — Progress Notes (Signed)
Spoke with pt and notified of results per Dr. Wert. Pt verbalized understanding and denied any questions. 

## 2019-05-13 ENCOUNTER — Telehealth: Payer: Self-pay | Admitting: Internal Medicine

## 2019-05-13 DIAGNOSIS — R490 Dysphonia: Secondary | ICD-10-CM

## 2019-05-13 NOTE — Telephone Encounter (Signed)
Please refer to ENT for severe hoarseness.  Warm Salt water gargles As needed   Will send to Dr. Melvyn Novas  For further recs if any   Please contact office for sooner follow up if symptoms do not improve or worsen or seek emergency care

## 2019-05-13 NOTE — Telephone Encounter (Signed)
1) be sure to brush teeth after use of symbicort, use arm and hammer or other backing soda based toothpaste and make a slurry out of it to gargle with and should help  2) be sure to follow all the instructions on avs re use of protonix/pepcid and diet including use non-mint/menthol hard rock candy to reduce urge to clear throat.  3) no other options but ent eval if getting worse on the above.

## 2019-05-13 NOTE — Telephone Encounter (Signed)
flonase is cortison and she's aleady on it   Would try delsym cough syrup stongest OTC that is avaible

## 2019-05-13 NOTE — Telephone Encounter (Signed)
Spoke with pt. States that her hoarseness has gotten worse since her consult with MW on 05/04/2019. Reports that she is following all of MW's recommendations to the letter. She finished the prednisone and antibiotic that was given to her; she feels better but her hoarseness won't go aware. Denies fever/chills, congestion/runny nose, active cough, sore throat, severe headache, joint pain, unexplained muscle aches, increased shortness of breath, loss of taste or smell, rash, N/V/D, abdominal pain, redness around/in the eye, increased weakness or unexplained bruising or bleeding.  Would like recommendations.  Tammy - please advise. Thanks.

## 2019-05-13 NOTE — Telephone Encounter (Signed)
Called spoke with patient and discussed Dr Gustavus Bryant recommendations in detail.  Patient has not tried the baking soda based toothpaste and has been using sugar-free cough drops to help soothe her throat but still feels the urge to cough.  Advised patient to check the ingredients on the cough drops as these usually contain some menthol.  Patient will try sugar free jolly ranchers, life savers or werther's.  Appt scheduled with Dr Melvyn Novas for next Friday 11.20.2020 (she works in [place] on Fridays only) in case these recommendations do not help; she will cancel if she notices improvement.  Patient will keep her 72.2.2020 4 week follow up with Dr. Melvyn Novas.  In the meantime, Dr Melvyn Novas patient is asking if Flonase may help with her hoarseness and throat clearing.  Patient denies any post-nasal drop.

## 2019-05-13 NOTE — Telephone Encounter (Signed)
Called spoke with patient and discussed recommendations as stated by [place] NP.  Patient is hesitant for referral; states that she is wary about seeing "another doctor" and would like to wait for Dr Gustavus Bryant recommendations before having referral placed.  Patient will proceed with warm salt gargles in the meantime.  Patient is aware MW is out of the office until Monday 11.16.2020.  Dr Melvyn Novas, please advise on any further recommendations for patient's lingering severe hoarseness.  Thank you.

## 2019-05-13 NOTE — Telephone Encounter (Signed)
Left message for patient to call back  

## 2019-05-14 MED ORDER — PREDNISONE 10 MG PO TABS: ORAL_TABLET | ORAL | 0 refills | Status: DC

## 2019-05-14 NOTE — Telephone Encounter (Signed)
Spoke with pt, she was wanting advice on the hoarseness in her throat and its getting worse. She states her cough is better. Are you saying she is already on flonase? Will Flonase help? I advised her that it would be better to discuss in the [facility] but her OV is not until 11/20. MW do you have any other suggestions for pt?

## 2019-05-14 NOTE — Telephone Encounter (Signed)
Stop symbicort for now flonase won't help Another Prednisone 10 mg take  4 each am x 2 days,   2 each am x 2 days,  1 each am x 2 days and stop and ent eval next available

## 2019-05-14 NOTE — Telephone Encounter (Signed)
Called spoke with patient and discussed Dr Gustavus Bryant recommendations; patient voiced her understanding.  Rx sent to verified pharmacy. ENT referral placed; [facility] doc preferable. Patient will stop Symbicort for now and not begin Flonase.  Nothing further needed at this time; will sign off.

## 2019-05-21 ENCOUNTER — Ambulatory Visit: Payer: BC Managed Care – PPO | Admitting: Internal Medicine

## 2019-06-02 ENCOUNTER — Ambulatory Visit: Payer: BC Managed Care – PPO | Admitting: Internal Medicine

## 2019-06-02 ENCOUNTER — Other Ambulatory Visit: Payer: Self-pay

## 2019-06-02 ENCOUNTER — Encounter: Payer: Self-pay | Admitting: Internal Medicine

## 2019-06-02 DIAGNOSIS — F17210 Nicotine dependence, cigarettes, uncomplicated: Secondary | ICD-10-CM

## 2019-06-02 DIAGNOSIS — J449 Chronic obstructive pulmonary disease, unspecified: Secondary | ICD-10-CM

## 2019-06-02 MED ORDER — SYMBICORT 80-4.5 MCG/ACT IN AERO: 2.0000 | INHALATION_SPRAY | Freq: Two times a day (BID) | RESPIRATORY_TRACT | 11 refills | Status: DC

## 2019-06-02 NOTE — Progress Notes (Signed)
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 Some better on saba / symbicort though not adherent and worsening doe since early 2020 so referred to pulmonary clinic 05/04/2019 by Dr   [name].  In 2015 wt around 220      History of Present Illness  05/04/2019  Pulmonary/ 1st office eval/Kristin Austin  Chief Complaint  Patient presents with  . Pulmonary Consult    Referred by Dr [name]. Pt c/o SOB "for years"- worse over the past several months. She also c/o cough with yellow sputum.  Dyspnea:  MMRC1 = can walk nl pace, flat grade, can't hurry or go uphills or steps s sob   Cough: worse in am's lots yellow mucus  Sleep: on cpap per Dr Phillips Odor  SABA use: has taken po, hfa and neb  rec Plan A = Automatic = Always=    Symbicort 80 Take 2 puffs first thing in am and then another 2 puffs about 12 hours later.   Work on inhaler technique:  relax and gently blow all the way out then take a nice smooth deep breath back in, triggering the inhaler at same time you start breathing in.  Prednisone 10 mg take  4 each am x 2 days,   2 each am x 2 days,  1 each am x 2 days and stop  Zpak Work on inhaler technique:  Plan B = Backup (to supplement plan A, not to replace it) Only use your albuterol inhaler as a rescue medication Plan C = Crisis (instead of Plan B but only if Plan B stops working) - only use your albuterol nebulizer if you first try Plan B and it fails to help > ok to use the nebulizer up to every 4 hours but if start needing it regularly call for immediate appointment Pantoprazole (protonix) 40 mg   Take  30-60 min before first meal of the day and Pepcid (famotidine)  20 mg one @  bedtime until return to office - GERD diet  The key is to stop smoking completely before smoking completely stops  you!   06/02/2019  f/u ov/Kristin Austin re: AB/ still smoking /still hoarse on symb 160 and not taking ppi ac qam Chief Complaint  Patient presents with  . Follow-up    Breathing has improved since the last visit. She is coughing less and her sputum is white to green/yellow. She is using her albuterol inhaler 1-2 x per day and has only used neb x 2 since her last visit.   Dyspnea:  MMRC1 = can walk nl pace, flat grade, can't hurry or go uphills or steps s sob   Cough: much less severe but worse in am  Min productive white mucus Sleeping: on cpap per Phillips Odor  SABA use: much less  02: none    No obvious day to day or daytime variability or assoc   mucus plugs or hemoptysis or cp or chest tightness, subjective wheeze or overt sinus or hb symptoms.   Sleeping as above without nocturnal  or early am exacerbation  of respiratory  c/o's or need for noct saba. Also denies any obvious fluctuation of symptoms with weather or environmental changes or other aggravating or alleviating factors except as outlined above   No unusual exposure hx or h/o  childhood pna/ asthma or knowledge of premature birth.  Current Allergies, Complete Past Medical History, Past Surgical History, Family History, and Social History were reviewed in [facility] electronic medical record.  ROS  The following are not active complaints unless bolded Hoarseness, sore throat, dysphagia, dental problems, itching, sneezing,  nasal congestion or discharge of excess mucus or purulent secretions, ear ache,   fever, chills, sweats, unintended wt loss or wt gain, classically pleuritic or exertional cp,  orthopnea pnd or arm/hand swelling  or leg swelling, presyncope, palpitations, abdominal pain, anorexia, nausea, vomiting, diarrhea  or change in bowel habits or change in bladder habits, change in stools or change in urine, dysuria, hematuria,  rash, arthralgias, visual complaints, headache, numbness, weakness or ataxia or problems with walking  or coordination,  change in mood or  memory.        Current Meds  Medication Sig  . albuterol (ACCUNEB) 1.25 MG/3ML nebulizer solution Take 1 ampule by nebulization every 4 (four) hours as needed for wheezing.  [name]. albuterol (VENTOLIN HFA) 108 (90 Base) MCG/ACT inhaler INHALE 1 TO 2 PUFFS INTO LUNGS EVERY 4 HOURS AS NEEDED  . buPROPion (WELLBUTRIN XL) 150 MG 24 hr tablet Not taking   . famotidine (PEPCID) 20 MG tablet One after supper  . fenofibrate 160 MG tablet Take 160 mg by mouth daily.   [name]. ibuprofen (ADVIL,MOTRIN) 800 MG tablet Take 1 tablet (800 mg total) by mouth 3 (three) times daily.  . metFORMIN (GLUCOPHAGE) 500 MG tablet Take 500 mg by mouth 2 (two) times daily with a meal.  . pantoprazole (PROTONIX) 40 MG tablet Take 1 tablet (40 mg total) by mouth daily. Take 30-60 min before first meal of the day  . SYMBICORT 80-4.5 MCG/ACT inhaler Inhale 2 puffs into the lungs 2 (two) times daily.                  Past Medical History:  Diagnosis Date  . Allergic rhinitis   . Asthma   . Depression   . Diabetes mellitus without complication (HCC)   . OSA on CPAP          Objective:     Obese hoarse amb wf nad   Wt Readings from Last 3 Encounters:  06/02/19 207 lb (93.9 kg)  05/04/19 206 lb (93.4 kg)  04/06/19 207 lb (93.9 kg)     Vital signs reviewed - Note on arrival 02 sats  99% on RA     HEENT : pt wearing mask not removed for exam due to covid - 19 concerns.   NECK :  without JVD/Nodes/TM/ nl carotid upstrokes bilaterally   LUNGS: no acc muscle use,  Min barrel  contour chest wall with bilateral  slightly decreased bs s audible wheeze and  without cough on insp or exp maneuvers and min  Hyperresonant  to  percussion bilaterally     CV:  RRR  no s3 or murmur or increase in P2, and no edema   ABD:  soft and nontender with pos end  insp Hoover's  in the supine position. No bruits or organomegaly appreciated, bowel sounds nl  MS:   Nl gait/  ext warm without  deformities, calf tenderness, cyanosis or clubbing No obvious joint restrictions   SKIN: warm and dry without lesions    NEURO:  alert, approp, nl sensorium with  no motor or cerebellar deficits apparent.          Lab Results  Component Value Date  WBC 9.0 05/04/2019   HGB 14.1 05/04/2019   HCT 41.1 05/04/2019   MCV 85.5 05/04/2019   PLT 292.0 05/04/2019       EOS                                                              0.7                                     05/04/2019       Assessment

## 2019-06-02 NOTE — Patient Instructions (Addendum)
For cough > mucinex dm 1200 mg every 12 hours as needed   Try lower strength symbicort = 80 Take 2 puffs first thing in am and then another 2 puffs about 12 hours later.   GERD (REFLUX)  is an extremely common cause of respiratory symptoms just like yours , many times with no obvious heartburn at all.    It can be treated with medication, but also with lifestyle changes including elevation of the head of your bed (ideally with 6 -8inch blocks under the headboard of your bed),  Smoking cessation, avoidance of late meals, excessive alcohol, and avoid fatty foods, chocolate, peppermint, colas, red wine, and acidic juices such as orange juice.  NO MINT OR MENTHOL PRODUCTS SO NO COUGH DROPS  USE SUGARLESS CANDY INSTEAD (Jolley ranchers or Stover's or Life Savers) or even ice chips will also do - the key is to swallow to prevent all throat clearing. NO OIL BASED VITAMINS - use powdered substitutes.  Avoid fish oil when coughing.   The key is to stop smoking completely before smoking completely stops you!  Please schedule a follow up office visit in 6 weeks, call sooner if needed

## 2019-06-03 ENCOUNTER — Encounter: Payer: Self-pay | Admitting: Internal Medicine

## 2019-06-03 NOTE — Assessment & Plan Note (Addendum)
Active smoker  - alpha one AT screen 05/04/2019   Level 178  MM - Eos 0.7 05/04/2019  With IgE 347  - 05/04/2019  After extensive coaching inhaler device,  effectiveness =    75% > continue symb 80 2bid but take it more consistently > continued 160 - 06/02/2019 changed to symb 80 2bid due to hoarseness  - 06/02/2019   Walked RA x two laps =  approx 5106ft @ moderate pace - stopped due to end of study  with sats of 97 at the end of the study and min sob     Main problem now appears to be related to upper airway, not copd or ab so rec again:  Lower strength sym 80 2bid with brush teeth p rx with baking soda based toothpaste Max rx for gerd  ent eval prn    I had an extended discussion with the patient reviewing all relevant studies completed to date and  lasting 15 to 20 minutes of a 25 minute visit  which included directly observing ambulatory 02 saturation study documented in a/p section of  today's  office note.  Each maintenance medication was reviewed in detail including most importantly the difference between maintenance and prns and under what circumstances the prns are to be triggered using an action plan format that is not reflected in the computer generated alphabetically organized AVS.     Please see AVS for specific instructions unique to this visit that I personally wrote and verbalized to the the pt in detail and then reviewed with pt  by my nurse highlighting any changes in therapy recommended at today's visit .

## 2019-06-03 NOTE — Assessment & Plan Note (Addendum)
Counseled re importance of smoking cessation but did not meet time criteria for separate billing    >>> advised resume wellbutrin to helps reduce urge to smoke

## 2019-07-09 ENCOUNTER — Ambulatory Visit: Payer: BC Managed Care – PPO | Admitting: Internal Medicine

## 2019-07-23 ENCOUNTER — Telehealth (INDEPENDENT_AMBULATORY_CARE_PROVIDER_SITE_OTHER): Payer: BC Managed Care – PPO | Admitting: Internal Medicine

## 2019-07-23 ENCOUNTER — Encounter: Payer: Self-pay | Admitting: Internal Medicine

## 2019-07-23 DIAGNOSIS — F17210 Nicotine dependence, cigarettes, uncomplicated: Secondary | ICD-10-CM | POA: Diagnosis not present

## 2019-07-23 DIAGNOSIS — J449 Chronic obstructive pulmonary disease, unspecified: Secondary | ICD-10-CM | POA: Diagnosis not present

## 2019-07-23 MED ORDER — AMOXICILLIN-POT CLAVULANATE 875-125 MG PO TABS: 1.0000 | ORAL_TABLET | Freq: Two times a day (BID) | ORAL | 0 refills | Status: AC

## 2019-07-23 NOTE — Progress Notes (Signed)
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 Some better on saba / symbicort though not adherent and worsening doe since early 2020 so referred to pulmonary clinic 05/04/2019 by Dr   [name].  In 2015 wt around 220     History of Present Illness  05/04/2019  Pulmonary/ 1st office eval/Kristin Austin  Chief Complaint  Patient presents with  . Pulmonary Consult    Referred by Dr [name]. Pt c/o SOB "for years"- worse over the past several months. She also c/o cough with yellow sputum.  Dyspnea:  MMRC1 = can walk nl pace, flat grade, can't hurry or go uphills or steps s sob   Cough: worse in am's lots yellow mucus  Sleep: on cpap per Dr Phillips Odor  SABA use: has taken po, hfa and neb  rec Plan A = Automatic = Always=    Symbicort 80 Take 2 puffs first thing in am and then another 2 puffs about 12 hours later.   Work on inhaler technique:  relax and gently blow all the way out then take a nice smooth deep breath back in, triggering the inhaler at same time you start breathing in.  Prednisone 10 mg take  4 each am x 2 days,   2 each am x 2 days,  1 each am x 2 days and stop  Zpak Work on inhaler technique:  Plan B = Backup (to supplement plan A, not to replace it) Only use your albuterol inhaler as a rescue medication Plan C = Crisis (instead of Plan B but only if Plan B stops working) - only use your albuterol nebulizer if you first try Plan B and it fails to help > ok to use the nebulizer up to every 4 hours but if start needing it regularly call for immediate appointment Pantoprazole (protonix) 40 mg   Take  30-60 min before first meal of the day and Pepcid (famotidine)  20 mg one @  bedtime until return to office - GERD diet  The key is to stop smoking completely before smoking completely stops  you!   06/02/2019  f/u ov/Kristin Austin re: AB/ still smoking /still hoarse on symb 160 and not taking ppi ac qam Chief Complaint  Patient presents with  . Follow-up    Breathing has improved since the last visit. She is coughing less and her sputum is white to green/yellow. She is using her albuterol inhaler 1-2 x per day and has only used neb x 2 since her last visit.   Dyspnea:  MMRC1 = can walk nl pace, flat grade, can't hurry or go uphills or steps s sob   Cough: much less severe but worse in am  Min productive white mucus Sleeping: on cpap per Phillips Odor  SABA use: much less  02: none  rec For cough > mucinex dm 1200 mg every 12 hours as needed  Try lower strength symbicort = 80 Take 2 puffs first thing in am and then another 2 puffs about 12 hours later.  GERD diet  The key is to stop smoking completely before smoking completely stops you!      Virtual Visit via Telephone Note 07/23/2019   I connected with Kristin Austin on 07/23/19 at  3:00 PM EST by telephone and verified that I  am speaking with the correct person using two identifiers.   I discussed the limitations, risks, security and privacy concerns of performing an evaluation and management service by telephone and the availability of in person appointments. I also discussed with the patient that there may be a patient responsible charge related to this service. The patient expressed understanding and agreed to proceed.   History of Present Illness: AB/still smoking/ better on symb 80, did not try mucine  Dyspnea:  MMRC1 = can walk nl pace, flat grade, can't hurry or go uphills or steps s sob     Cough: worse assoc with nasal congestion 07/20/19 turning green  Sleeping: ok  SABA use: now only  a few times this week, no neb  02: none    No obvious day to day or daytime variability or assoc excess/ purulent sputum or mucus plugs or hemoptysis or cp or chest tightness, subjective wheeze or overt sinus or hb symptoms.    Also denies  any obvious fluctuation of symptoms with weather or environmental changes or other aggravating or alleviating factors except as outlined above.   Meds reviewed/ med reconciliation completed     Current Meds  Medication Sig  . albuterol (ACCUNEB) 1.25 MG/3ML nebulizer solution Take 1 ampule by nebulization every 4 (four) hours as needed for wheezing.  [name] albuterol (VENTOLIN HFA) 108 (90 Base) MCG/ACT inhaler INHALE 1 TO 2 PUFFS INTO LUNGS EVERY 4 HOURS AS NEEDED  . famotidine (PEPCID) 20 MG tablet One after supper  . fenofibrate 160 MG tablet Take 160 mg by mouth daily.   [name] ibuprofen (ADVIL,MOTRIN) 800 MG tablet Take 1 tablet (800 mg total) by mouth 3 (three) times daily.  . metFORMIN (GLUCOPHAGE) 500 MG tablet Take 500 mg by mouth 2 (two) times daily with a meal.  . pantoprazole (PROTONIX) 40 MG tablet Take 1 tablet (40 mg total) by mouth daily. Take 30-60 min before first meal of the day  . SYMBICORT 80-4.5 MCG/ACT inhaler Inhale 2 puffs into the lungs 2 (two) times daily.         Observations/Objective: Nasal tone to voice/ congestion tone to voice/rattling cough / no conversational sob    Assessment and Plan: See problem list for active a/p's   Follow Up Instructions: See avs for instructions unique to this ov which includes revised/ updated med list     I discussed the assessment and treatment plan with the patient. The patient was provided an opportunity to ask questions and all were answered. The patient agreed with the plan and demonstrated an understanding of the instructions.   The patient was advised to call back or seek an in-person evaluation if the symptoms worsen or if the condition fails to improve as anticipated.  I provided 22  minutes of non-face-to-face time during this encounter.   Christinia Gully, MD            Past Medical History:  Diagnosis Date  . Allergic rhinitis   . Asthma   . Depression   . Diabetes mellitus without complication ([place])   .  OSA on CPAP

## 2019-07-23 NOTE — Patient Instructions (Addendum)
Augmentin 875 mg take one pill twice daily  X 10 days - take at breakfast and supper with large glass of water.  It would help reduce the usual side effects (diarrhea and yeast infections) if you ate cultured yogurt at lunch.  If not better be sure to keep your appt with Dr Suszanne Conners   For cough, nasal congestion > mucinex dm 1200 up to one every 12 hours as needed   The key is to stop smoking completely before smoking completely stops you!   Work on inhaler technique:  relax and gently blow all the way out then take a nice smooth deep breath back in, triggering the inhaler at same time you start breathing in.  Hold for up to 5 seconds if you can. Blow out thru nose. Rinse and gargle with water when done     No other medications changes    Please schedule a follow up visit in 3 months but call sooner if needed  with all medications /inhalers/ solutions in hand so we can verify exactly what you are taking. This includes all medications from all doctors and over the counters

## 2019-07-23 NOTE — Assessment & Plan Note (Signed)
Active smoker  - alpha one AT screen 05/04/2019   Level 178  MM - Eos 0.7 05/04/2019  With IgE 347  - 05/04/2019  After extensive coaching inhaler device,  effectiveness =    75% > continue symb 80 2bid but take it more consistently > continued 160 - 06/02/2019 changed to symb 80 2bid due to hoarseness  - 06/02/2019   Walked RA x two laps =  approx 542ft @ moderate pace - stopped due to end of study  with sats of 97 at the end of the study and min sob   Worsening cough in setting of uri/ ? Sinusitis but note no more sob or increase in need for saba esp not neb, which is a good sign despite ongoing smoking   rec  Augmentin 875 mg take one pill twice daily  X 10 days then f/u with [facility] prn  No change maint rx with symb 80 2bid  If more systemic symptoms evolved advised need covid 19 testing.

## 2019-07-23 NOTE — Assessment & Plan Note (Signed)
Counseled re importance of smoking cessation but did not meet time criteria for separate billing     Each maintenance medication was reviewed in detail including most importantly the difference between maintenance and as needed and under what circumstances the prns are to be used.  Please see AVS for specific  Instructions which are unique to this visit and I personally typed out  which were reviewed in detail in writing with the patient and a copy provided via MyChart

## 2019-07-29 DIAGNOSIS — R102 Pelvic and perineal pain: Secondary | ICD-10-CM | POA: Diagnosis not present

## 2019-07-29 DIAGNOSIS — N760 Acute vaginitis: Secondary | ICD-10-CM | POA: Diagnosis not present

## 2019-07-29 DIAGNOSIS — R309 Painful micturition, unspecified: Secondary | ICD-10-CM | POA: Diagnosis not present

## 2019-08-06 DIAGNOSIS — J310 Chronic rhinitis: Secondary | ICD-10-CM | POA: Diagnosis not present

## 2019-08-06 DIAGNOSIS — F17210 Nicotine dependence, cigarettes, uncomplicated: Secondary | ICD-10-CM | POA: Diagnosis not present

## 2019-08-06 DIAGNOSIS — J343 Hypertrophy of nasal turbinates: Secondary | ICD-10-CM | POA: Diagnosis not present

## 2019-08-06 DIAGNOSIS — J342 Deviated nasal septum: Secondary | ICD-10-CM | POA: Diagnosis not present

## 2019-08-06 DIAGNOSIS — R490 Dysphonia: Secondary | ICD-10-CM | POA: Diagnosis not present

## 2019-08-13 DIAGNOSIS — F419 Anxiety disorder, unspecified: Secondary | ICD-10-CM | POA: Diagnosis not present

## 2019-08-13 DIAGNOSIS — Z6837 Body mass index (BMI) 37.0-37.9, adult: Secondary | ICD-10-CM | POA: Diagnosis not present

## 2019-08-13 DIAGNOSIS — Z23 Encounter for immunization: Secondary | ICD-10-CM | POA: Diagnosis not present

## 2019-08-13 DIAGNOSIS — L089 Local infection of the skin and subcutaneous tissue, unspecified: Secondary | ICD-10-CM | POA: Diagnosis not present

## 2019-08-13 DIAGNOSIS — E1165 Type 2 diabetes mellitus with hyperglycemia: Secondary | ICD-10-CM | POA: Diagnosis not present

## 2019-08-13 LAB — HEMOGLOBIN A1C: Hemoglobin A1C: 9.5

## 2019-08-18 ENCOUNTER — Encounter: Payer: Self-pay | Admitting: Internal Medicine

## 2019-08-18 DIAGNOSIS — J310 Chronic rhinitis: Secondary | ICD-10-CM | POA: Insufficient documentation

## 2019-09-02 DIAGNOSIS — Z6836 Body mass index (BMI) 36.0-36.9, adult: Secondary | ICD-10-CM | POA: Diagnosis not present

## 2019-09-02 DIAGNOSIS — Z01419 Encounter for gynecological examination (general) (routine) without abnormal findings: Secondary | ICD-10-CM | POA: Diagnosis not present

## 2019-09-02 IMAGING — MR MR LUMBAR SPINE W/O CM
4 of 5 series · 26 of 48 positions shown · non-contrast
Comparison: None.

CLINICAL DATA: Back pain, right leg pain

EXAM:
MRI LUMBAR SPINE WITHOUT CONTRAST
TECHNIQUE: Multiplanar, multisequence MR imaging of the lumbar spine was
performed. No intravenous contrast was administered.

[Series 3: T2 post-contrast · sagittal · 4.0mm · 0.55mm/px · 5 of 14 slices shown]
[im 1/14]
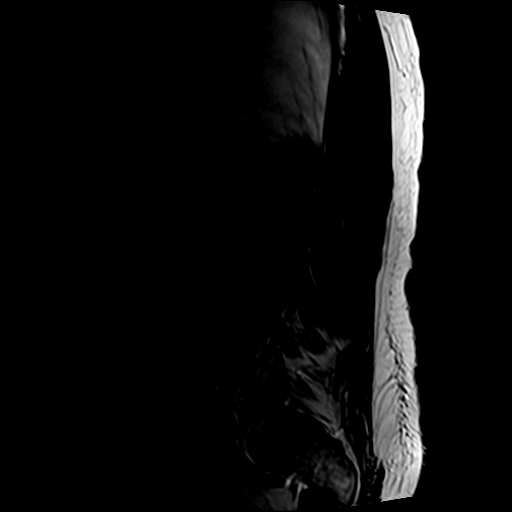
[im 4/14]
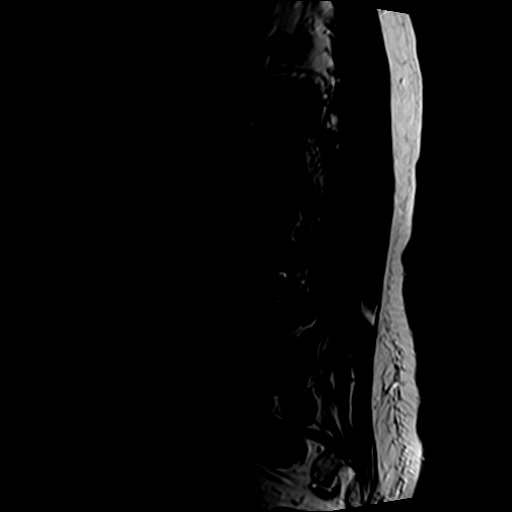
[im 7/14]
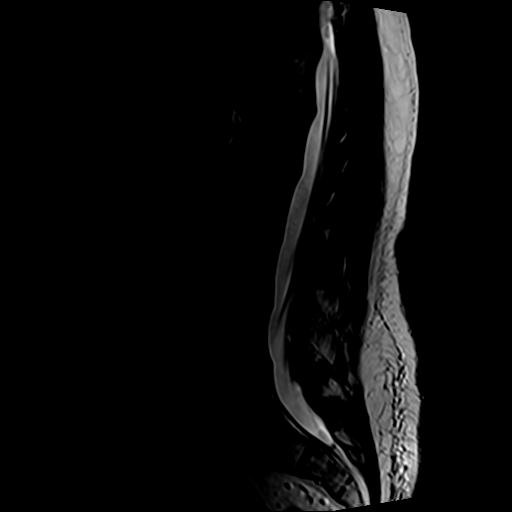
[im 10/14]
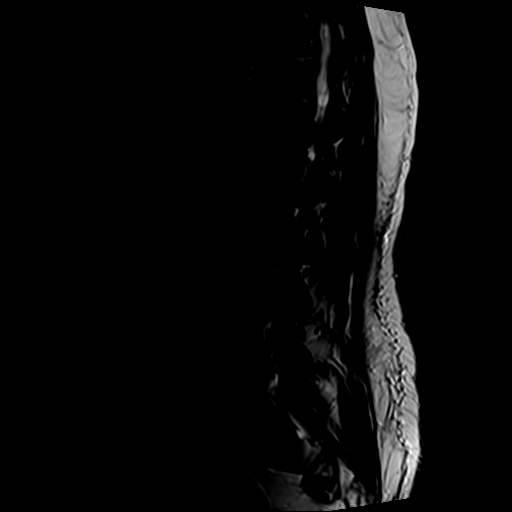
[im 14/14]
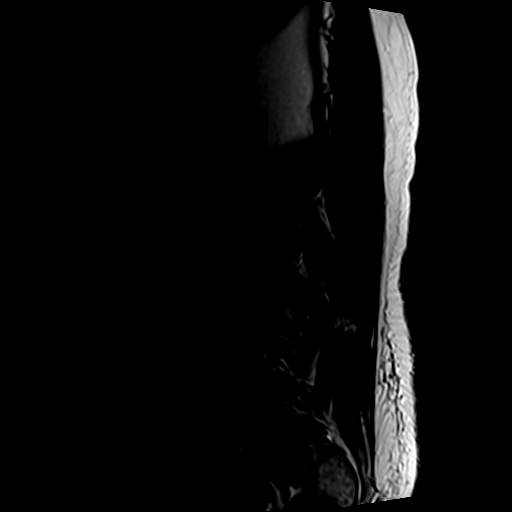

[Series 5: T1 · sagittal · 4.0mm · 0.55mm/px · 6 of 14 slices shown (1 of 2)]
[im 1/14]
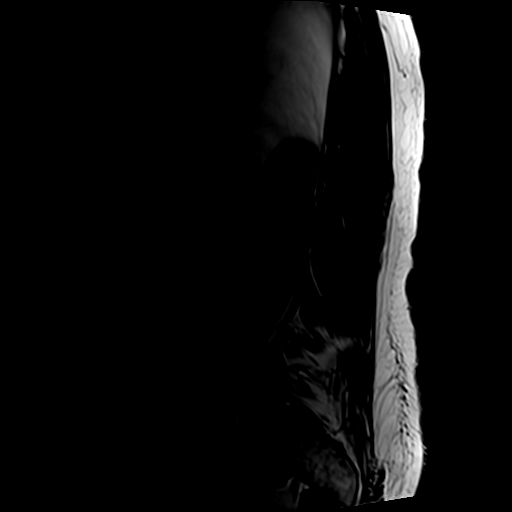
[im 3/14]
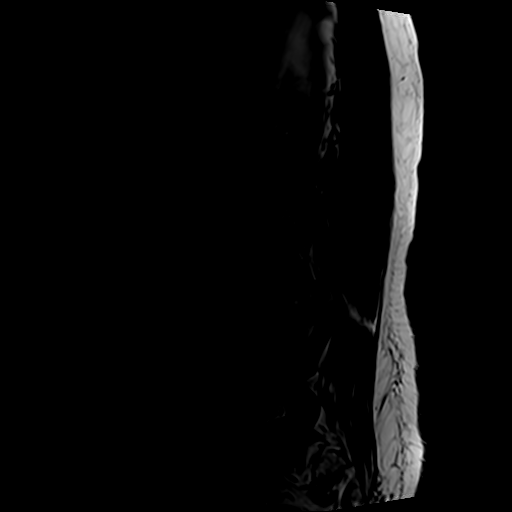
[im 6/14]
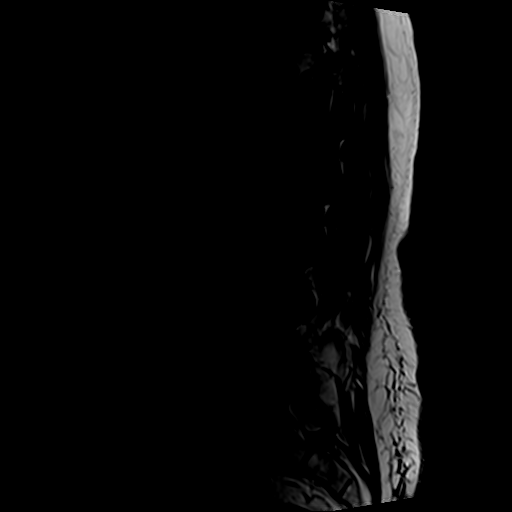
[im 8/14]
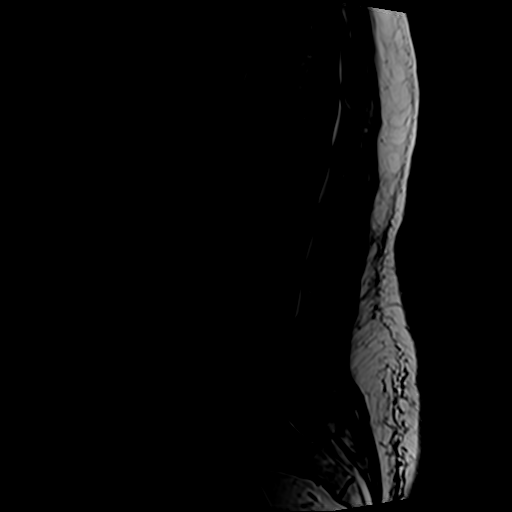
[im 11/14]
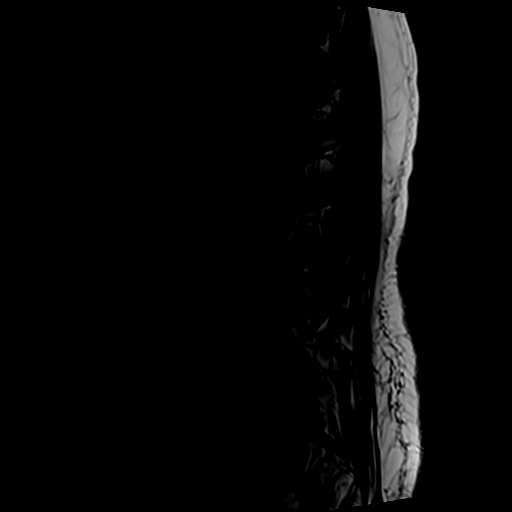
[im 14/14]
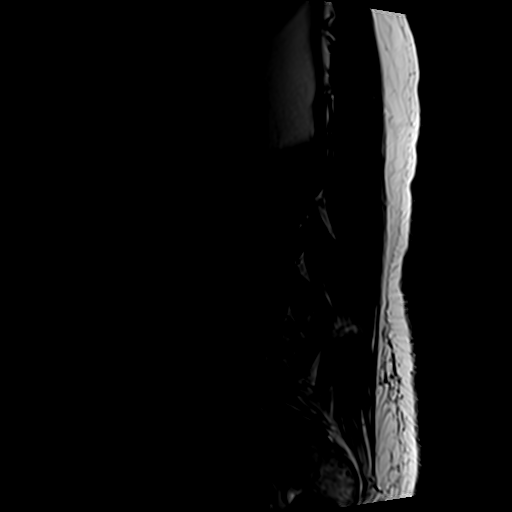

[Series 6: T1 · axial · 4.0mm · 0.35mm/px · z∈[-117,+62]mm · 5 of 39 slices shown (2 of 2)]
[im 3/39]
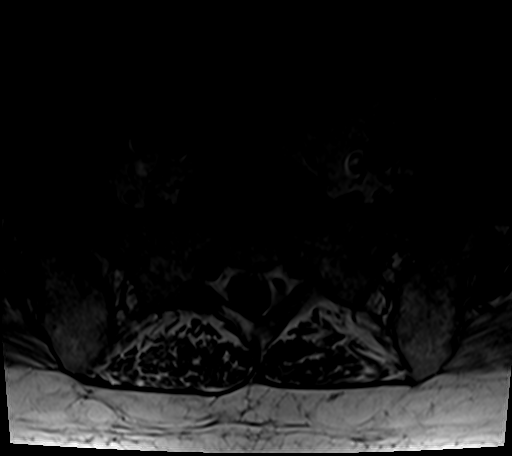
[im 6/39]
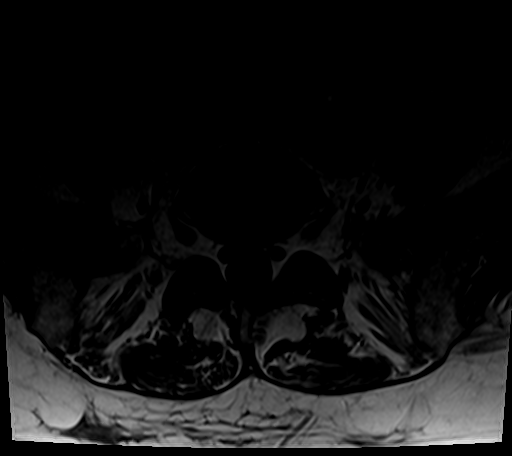
[im 8/39]
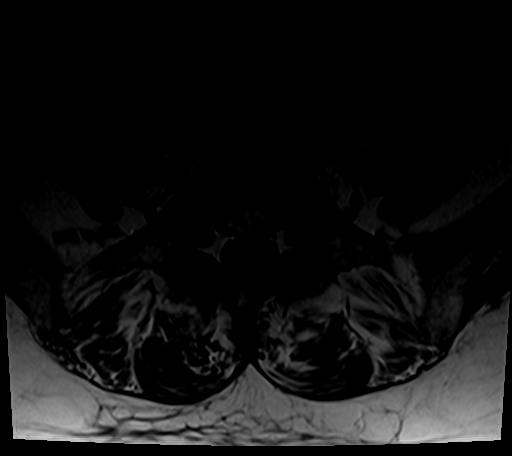
[im 21/39]
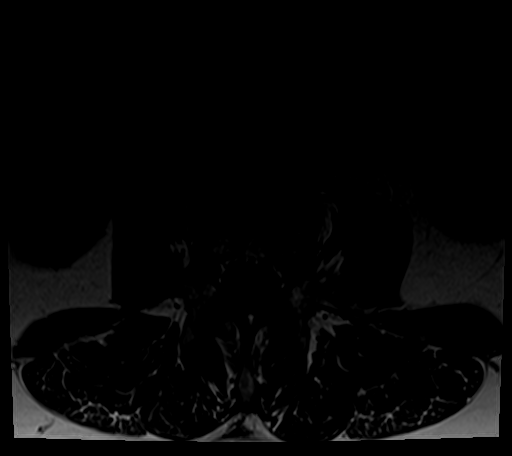
[im 33/39]
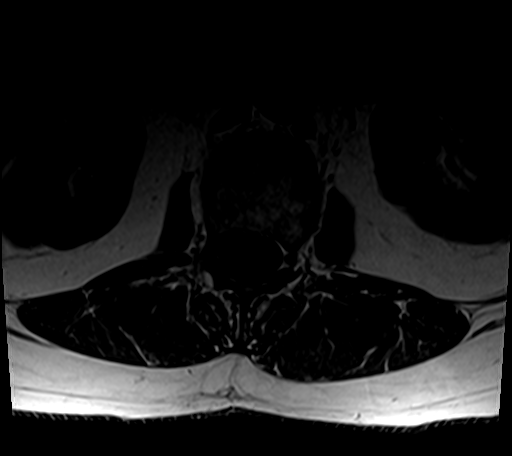

[Series 7: T2 · axial · 4.0mm · 0.70mm/px · z∈[-117,+93]mm · 10 of 39 slices shown]
[im 3/39]
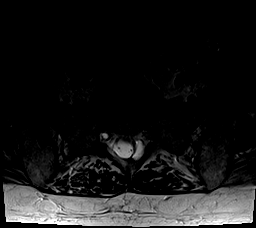
[im 6/39]
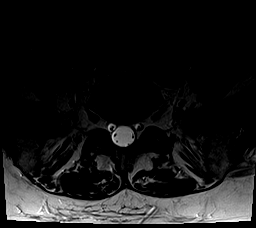
[im 8/39]
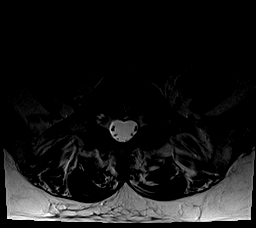
[im 13/39]
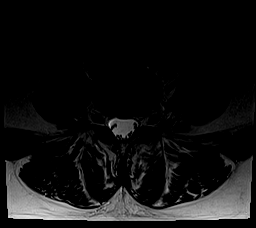
[im 18/39]
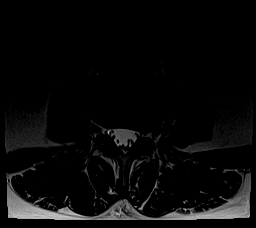
[im 21/39]
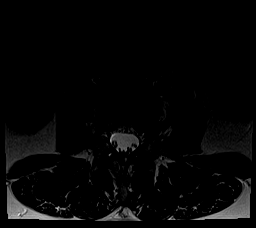
[im 23/39]
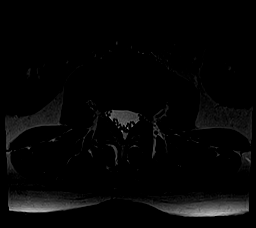
[im 28/39]
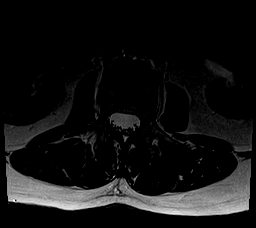
[im 33/39]
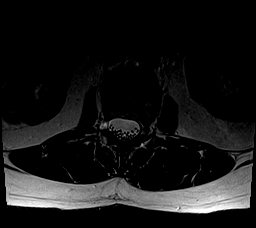
[im 39/39]
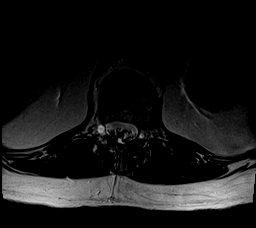

[26 of 48 positions shown; findings below may reference images not displayed]

FINDINGS: Segmentation:  Standard.

Alignment:  Physiologic.

Vertebrae: No fracture, evidence of discitis, or bone lesion.
Schmorl's nodes involving the T11, T12, L1, L2, L3 and L4 vertebral
bodies.

Conus medullaris and cauda equina: Conus extends to the T12 level.
Conus and cauda equina appear normal.

Paraspinal and other soft tissues: No acute paraspinal abnormality.

Disc levels:

Disc spaces: Lumbar disc spaces are maintained. Degenerative disc
disease with disc height loss at T11-12.

T12-L1: Mild broad-based disc bulge. Small right perineural cyst. No
evidence of neural foraminal stenosis. No central canal stenosis.

L1-L2: Mild broad-based disc bulge. No evidence of neural foraminal
stenosis. No central canal stenosis.

L2-L3: Broad shallow left foraminal disc protrusion. No evidence of
neural foraminal stenosis. No central canal stenosis.

L3-L4: Small shallow left foraminal disc protrusion. No evidence of
neural foraminal stenosis. No central canal stenosis.

L4-L5: No significant disc bulge. No evidence of neural foraminal
stenosis. No central canal stenosis. Mild bilateral facet
arthropathy.

L5-S1: No significant disc bulge. No evidence of neural foraminal
stenosis. No central canal stenosis. Mild bilateral facet
arthropathy.
IMPRESSION: 1. At L2-3 and L3-4 there are small shallow left foraminal disc
protrusions without foraminal stenosis or nerve root impingement.

## 2019-09-21 ENCOUNTER — Encounter: Payer: Self-pay | Admitting: "Endocrinology

## 2019-09-21 ENCOUNTER — Other Ambulatory Visit: Payer: Self-pay

## 2019-09-21 ENCOUNTER — Ambulatory Visit: Payer: BC Managed Care – PPO | Admitting: "Endocrinology

## 2019-09-21 VITALS — BP 111/76 | HR 90 | Ht 63.0 in | Wt 208.0 lb

## 2019-09-21 DIAGNOSIS — E782 Mixed hyperlipidemia: Secondary | ICD-10-CM | POA: Diagnosis not present

## 2019-09-21 DIAGNOSIS — E66812 Obesity, class 2: Secondary | ICD-10-CM | POA: Insufficient documentation

## 2019-09-21 DIAGNOSIS — Z6836 Body mass index (BMI) 36.0-36.9, adult: Secondary | ICD-10-CM

## 2019-09-21 DIAGNOSIS — F17200 Nicotine dependence, unspecified, uncomplicated: Secondary | ICD-10-CM

## 2019-09-21 DIAGNOSIS — E1165 Type 2 diabetes mellitus with hyperglycemia: Secondary | ICD-10-CM | POA: Insufficient documentation

## 2019-09-21 DIAGNOSIS — E785 Hyperlipidemia, unspecified: Secondary | ICD-10-CM | POA: Insufficient documentation

## 2019-09-21 NOTE — Patient Instructions (Signed)

## 2019-09-21 NOTE — Progress Notes (Signed)
Endocrinology Consult Note       09/21/2019, 4:55 PM   Subjective:    Patient ID: Kristin Austin, female    DOB: [birth year].  Kristin Austin is being seen in consultation for management of currently uncontrolled symptomatic diabetes requested by  Kristin Found, MD.   Past Medical History:  Diagnosis Date  . Allergic rhinitis   . Asthma   . Depression   . Diabetes mellitus without complication (HCC)   . OSA on CPAP     Past Surgical History:  Procedure Laterality Date  . COLONOSCOPY      Social History   Socioeconomic History  . Marital status: Divorced    Spouse name: Not on file  . Number of children: Not on file  . Years of education: Not on file  . Highest education level: Not on file  Occupational History  . Not on file  Tobacco Use  . Smoking status: Current Every Day Smoker    Packs/day: 1.00    Years: 34.00    Pack years: 34.00    Types: Cigarettes  . Smokeless tobacco: Never Used  Substance and Sexual Activity  . Alcohol use: No  . Drug use: No  . Sexual activity: Not on file  Other Topics Concern  . Not on file  Social History Narrative  . Not on file   Social Determinants of Health   Financial Resource Strain:   . Difficulty of Paying Living Expenses:   Food Insecurity:   . Worried About Programme researcher, broadcasting/film/video in the Last Year:   . Barista in the Last Year:   Transportation Needs:   . Freight forwarder (Medical):   [name] Lack of Transportation (Non-Medical):   Physical Activity:   . Days of Exercise per Week:   . Minutes of Exercise per Session:   Stress:   . Feeling of Stress :   Social Connections:   . Frequency of Communication with Friends and Family:   . Frequency of Social Gatherings with Friends and Family:   . Attends Religious Services:   . Active Member of Clubs or Organizations:   . Attends Banker Meetings:   [name] Marital Status:      Family History  Problem Relation Age of Onset  . Cancer Mother   . Diabetes Mellitus II Mother   . Hypertension Mother   . Prostate cancer Father   . Diabetes Mellitus II Father   . Hypertension Sister     Outpatient Encounter Medications as of 09/21/2019  Medication Sig  . budesonide-formoterol (SYMBICORT) 160-4.5 MCG/ACT inhaler Inhale 2 puffs into the lungs 2 (two) times daily.  [name] albuterol (ACCUNEB) 1.25 MG/3ML nebulizer solution Take 1 ampule by nebulization every 4 (four) hours as needed for wheezing.  [name] albuterol (VENTOLIN HFA) 108 (90 Base) MCG/ACT inhaler INHALE 1 TO 2 PUFFS INTO LUNGS EVERY 4 HOURS AS NEEDED  . buPROPion (WELLBUTRIN XL) 150 MG 24 hr tablet Take 150 mg by mouth daily.   . famotidine (PEPCID) 20 MG tablet One after supper  . fenofibrate 160 MG tablet Take 160 mg by  mouth daily.   [name] ibuprofen (ADVIL,MOTRIN) 800 MG tablet Take 1 tablet (800 mg total) by mouth 3 (three) times daily.  . metFORMIN (GLUCOPHAGE) 1000 MG tablet metformin 1,000 mg tablet  TAKE 1 TABLET BY MOUTH TWICE DAILY (WITH MORNING AND EVENING MEAL)  . pantoprazole (PROTONIX) 40 MG tablet Take 1 tablet (40 mg total) by mouth daily. Take 30-60 min before first meal of the day  . [DISCONTINUED] metFORMIN (GLUCOPHAGE) 500 MG tablet Take 500 mg by mouth 2 (two) times daily with a meal.  . [DISCONTINUED] SYMBICORT 80-4.5 MCG/ACT inhaler Inhale 2 puffs into the lungs 2 (two) times daily.   No facility-administered encounter medications on file as of 09/21/2019.    ALLERGIES: Allergies  Allergen Reactions  . Codeine Nausea And Vomiting  . Penicillins Rash    VACCINATION STATUS:  There is no immunization history on file for this patient.  Diabetes She presents for her initial diabetic visit. She has type 2 diabetes mellitus. Onset time: She was then most at approximate age of 89 years. Her disease course has been worsening. There are no hypoglycemic associated symptoms. Pertinent negatives for  hypoglycemia include no confusion, headaches, pallor or seizures. Associated symptoms include blurred vision, fatigue, polydipsia and polyuria. Pertinent negatives for diabetes include no chest pain and no polyphagia. There are no hypoglycemic complications. Symptoms are worsening. There are no diabetic complications. Risk factors for coronary artery disease include dyslipidemia, diabetes mellitus, family history, obesity, hypertension, sedentary lifestyle, tobacco exposure and post-menopausal. Her weight is increasing steadily. She is following a generally unhealthy diet. When asked about meal planning, she reported none. She has not had a previous visit with a dietitian. She rarely participates in exercise. (She did not bring any logs nor meter with her.  Her most recent A1c was 9.5%.) An ACE inhibitor/angiotensin II receptor blocker is not being taken. Eye exam is current.  Hyperlipidemia This is a chronic problem. The current episode started more than 1 year ago. Exacerbating diseases include diabetes and obesity. Pertinent negatives include no chest pain, myalgias or shortness of breath. Current antihyperlipidemic treatment includes fibric acid derivatives. Risk factors for coronary artery disease include dyslipidemia, diabetes mellitus, family history, obesity, hypertension, a sedentary lifestyle and post-menopausal.     Review of Systems  Constitutional: Positive for fatigue. Negative for chills, fever and unexpected weight change.  HENT: Negative for trouble swallowing and voice change.   Eyes: Positive for blurred vision. Negative for visual disturbance.  Respiratory: Negative for cough, shortness of breath and wheezing.   Cardiovascular: Negative for chest pain, palpitations and leg swelling.  Gastrointestinal: Negative for diarrhea, nausea and vomiting.  Endocrine: Positive for polydipsia and polyuria. Negative for cold intolerance, heat intolerance and polyphagia.  Musculoskeletal: Negative  for arthralgias and myalgias.  Skin: Negative for color change, pallor, rash and wound.  Neurological: Negative for seizures and headaches.  Psychiatric/Behavioral: Negative for confusion and suicidal ideas.    Objective:    Vitals with BMI 09/21/2019 06/02/2019 05/04/2019  Height 5\' 3"  5\' 3"  5\' 3"   Weight 208 lbs 207 lbs 206 lbs  BMI 36.85 36.68 36.5  Systolic 111 120  Diastolic 76 68 74  Pulse 90 80 85    BP 111/76   Pulse 90   Ht 5\' 3"  (1.6 m)   Wt 208 lb (94.3 kg)   BMI 36.85 kg/m   Wt Readings from Last 3 Encounters:  09/21/19 208 lb (94.3 kg)  06/02/19 207 lb (93.9 kg)  05/04/19 206 lb (  93.4 kg)     Physical Exam Constitutional:      Appearance: She is well-developed.  HENT:     Head: Normocephalic and atraumatic.  Neck:     Thyroid: No thyromegaly.     Trachea: No tracheal deviation.  Cardiovascular:     Rate and Rhythm: Normal rate and regular rhythm.  Pulmonary:     Effort: Pulmonary effort is normal.     Breath sounds: Normal breath sounds.  Abdominal:     General: Bowel sounds are normal.     Palpations: Abdomen is soft.     Tenderness: There is no abdominal tenderness. There is no guarding.  Musculoskeletal:        General: Normal range of motion.     Cervical back: Normal range of motion and neck supple.  Skin:    General: Skin is warm and dry.     Coloration: Skin is not pale.     Findings: No erythema or rash.  Neurological:     Mental Status: She is alert and oriented to person, place, and time.     Cranial Nerves: No cranial nerve deficit.     Coordination: Coordination normal.     Deep Tendon Reflexes: Reflexes are normal and symmetric.  Psychiatric:        Judgment: Judgment normal.      Diabetic Labs (most recent): Lab Results  Component Value Date   HGBA1C 9.5 08/13/2019       Assessment & Plan:   1. Uncontrolled type 2 diabetes mellitus with hyperglycemia ([place])  - Leyani D Guiles has currently uncontrolled symptomatic  type 2 DM since  54 years of age,  with most recent A1c of 9.5 %. Recent labs reviewed.  Her previous A1c measurements where 8.6%, 9%, 6.1%, 6.2%. - I had a long discussion with her about the progressive nature of diabetes and the pathology behind its complications. -her diabetes is complicated by smoking, obesity/sedentary life and she remains at a high risk for more acute and chronic complications which include CAD, CVA, CKD, retinopathy, and neuropathy. These are all discussed in detail with her.  - I have counseled her on diet  and weight management  by adopting a carbohydrate restricted/protein rich diet. Patient is encouraged to switch to  unprocessed or minimally processed     complex starch and increased protein intake (animal or plant source), fruits, and vegetables. -  she is advised to stick to a routine mealtimes to eat 3 meals  a day and avoid unnecessary snacks ( to snack only to correct hypoglycemia).   - she admits that there is a room for improvement in her food and drink choices. - Suggestion is made for her to avoid simple carbohydrates  from her diet including Cakes, Sweet Desserts, Ice Cream, Soda (diet and regular), Sweet Tea, Candies, Chips, Cookies, Store Bought Juices, Alcohol in Excess of  1-2 drinks a day, Artificial Sweeteners,  Coffee Creamer, and "Sugar-free" Products. This will help patient to have more stable blood glucose profile and potentially avoid unintended weight gain.  - she will be scheduled with Jearld Fenton, RDN, CDE for diabetes education.  - I have approached her with the following individualized plan to manage  her diabetes and patient agrees:   -Based on her current and prevailing glycemic burden, she may need insulin treatment in order for her to achieve control of diabetes to target.  However, patient promises to do better on her diet and exercise and will be continued  only on her current regimen of Metformin.  - she is advised to continue Metformin  1000 mg p.o. twice daily, therapeutically suitable for patient . - she will be considered for low-dose cautious utilization of incretin therapy  as appropriate next visit given her ongoing history of heavy smoking.  - Specific targets for  A1c;  LDL, HDL,  and Triglycerides were discussed with the patient.  2) Blood Pressure /Hypertension:  her blood pressure is  controlled to target.   She is not on any antihypertensive medications.  3) Lipids/Hyperlipidemia: No recent lipid panel to review.  She is advised to continue her fenofibrate 160 mg p.o. nightly.  She will be considered for fasting lipid panel before her next visit.    4)  Weight/Diet:  Body mass index is 36.85 kg/m.  -   clearly complicating her diabetes care.   she is  a candidate for weight loss. I discussed with her the fact that loss of 5 - 10% of her  current body weight will have the most impact on her diabetes management.  Exercise, and detailed carbohydrates information provided  -  detailed on discharge instructions.  5) Chronic Care/Health Maintenance:  -she  Is not on ACEI/ARB and Statin medications and  is encouraged to initiate and continue to follow up with Ophthalmology, Dentist,  Podiatrist at least yearly or according to recommendations, and advised to  quit smoking. I have recommended yearly flu vaccine and pneumonia vaccine at least every 5 years; moderate intensity exercise for up to 150 minutes weekly; and  sleep for at least 7 hours a day.  The patient was counseled on the dangers of tobacco use, and was advised to quit.  Reviewed strategies to maximize success, including removing cigarettes and smoking materials from environment.   - she is  advised to maintain close follow up with Kristin Found, MD for primary care needs, as well as her other providers for optimal and coordinated care.   - Time spent in this patient care: 60 min, of which > 50% was spent in  counseling  her about her currently uncontrolled,  symptomatic type 2 diabetes, hyperlipidemia, obesity, smoking and the rest reviewing her blood glucose logs , discussing her hypoglycemia and hyperglycemia episodes, reviewing her current and  previous labs / studies  ( including abstraction from other facilities) and medications  doses and developing a  long term treatment plan based on the latest standards of care/ guidelines; and documenting her care.    Please refer to Patient Instructions for Blood Glucose Monitoring and Insulin/Medications Dosing Guide"  in media tab for additional information. Please  also refer to " Patient Self Inventory" in the Media  tab for reviewed elements of pertinent patient history.  Elanna D Fukushima participated in the discussions, expressed understanding, and voiced agreement with the above plans.  All questions were answered to her satisfaction. she is encouraged to contact clinic should she have any questions or concerns prior to her return visit.   Follow up plan: - Return in about 9 weeks (around 11/23/2019) for Bring Meter and Logs- A1c in Office, Follow up with Pre-visit Labs.  Marquis Lunch, MD [facility] [address] [place], [place] [address] Phone: [phone]  Fax: [phone]    09/21/2019, 4:55 PM  This note was partially dictated with voice recognition software. Similar sounding words can be transcribed inadequately or may not  be corrected upon review.

## 2019-11-04 ENCOUNTER — Other Ambulatory Visit: Payer: Self-pay | Admitting: Obstetrics and Gynecology

## 2019-11-04 DIAGNOSIS — R921 Mammographic calcification found on diagnostic imaging of breast: Secondary | ICD-10-CM

## 2019-11-24 ENCOUNTER — Ambulatory Visit: Payer: BC Managed Care – PPO | Admitting: "Endocrinology

## 2020-01-20 ENCOUNTER — Other Ambulatory Visit: Payer: Self-pay | Admitting: Internal Medicine

## 2020-01-20 DIAGNOSIS — J449 Chronic obstructive pulmonary disease, unspecified: Secondary | ICD-10-CM

## 2020-01-24 ENCOUNTER — Other Ambulatory Visit: Payer: Self-pay | Admitting: Internal Medicine

## 2020-01-24 DIAGNOSIS — J449 Chronic obstructive pulmonary disease, unspecified: Secondary | ICD-10-CM

## 2020-01-27 DIAGNOSIS — Z23 Encounter for immunization: Secondary | ICD-10-CM | POA: Diagnosis not present

## 2020-02-01 ENCOUNTER — Telehealth: Payer: Self-pay | Admitting: Internal Medicine

## 2020-02-01 NOTE — Telephone Encounter (Signed)
PA request was received from (pharmacy): yes, [facility] Phone:[phone]  Medication name and strength: Pantoprazole 40mg  DR. Ordering Provider:  Was PA started with [facility]?: yes If yes, please enter KEY: [id number] Medication tried and failed: Pepcid Covered Alternatives: Esoneprazole, omeprazole and lansoprole  PA sent to plan, time frame for approval / denial: 72 hours Routing to [place] for follow-up

## 2020-02-02 NOTE — Telephone Encounter (Signed)
Checked status of PA and it is still in review.

## 2020-02-10 NOTE — Telephone Encounter (Signed)
Omeprazole 40 mg Take 30- 60 min before your first and last meals of the day  X 3 months   Will need ov before 3 months if wants [place] to keep prescribing long term or let pcp take over as I don't see a f/u ov

## 2020-02-10 NOTE — Telephone Encounter (Signed)
PA denied for pantoprazole- covered alternatives include esomeprazole, lansoprazole or omeprazole  She has not tried any of these looking back at med hx Please advise, thanks

## 2020-02-10 NOTE — Telephone Encounter (Signed)
Attempted to call pt but unable to reach. Left message for her to return call. 

## 2020-02-18 DIAGNOSIS — Z23 Encounter for immunization: Secondary | ICD-10-CM | POA: Diagnosis not present

## 2020-03-23 ENCOUNTER — Other Ambulatory Visit: Payer: BC Managed Care – PPO

## 2020-03-23 ENCOUNTER — Other Ambulatory Visit: Payer: Self-pay | Admitting: Internal Medicine

## 2020-03-23 DIAGNOSIS — Z20822 Contact with and (suspected) exposure to covid-19: Secondary | ICD-10-CM | POA: Diagnosis not present

## 2020-03-24 LAB — NOVEL CORONAVIRUS, NAA: SARS-CoV-2, NAA: NOT DETECTED

## 2020-03-24 LAB — SARS-COV-2, NAA 2 DAY TAT

## 2020-03-24 LAB — SPECIMEN STATUS REPORT

## 2020-06-06 ENCOUNTER — Other Ambulatory Visit: Payer: Self-pay | Admitting: Internal Medicine

## 2020-06-09 ENCOUNTER — Telehealth: Payer: Self-pay | Admitting: Internal Medicine

## 2020-06-09 MED ORDER — OMEPRAZOLE 40 MG PO CPDR: 40.0000 mg | DELAYED_RELEASE_CAPSULE | Freq: Two times a day (BID) | ORAL | 3 refills | Status: DC

## 2020-06-09 MED ORDER — BUDESONIDE-FORMOTEROL FUMARATE 80-4.5 MCG/ACT IN AERO: 2.0000 | INHALATION_SPRAY | Freq: Two times a day (BID) | RESPIRATORY_TRACT | 1 refills | Status: DC

## 2020-06-09 NOTE — Telephone Encounter (Signed)
Ok to try symb 80 Take 2 puffs first thing in am and then another 2 puffs about 12 hours later and if fails can try the PA again  Also ok to call in the omeprazole as above

## 2020-06-09 NOTE — Telephone Encounter (Signed)
I have sent the omeprazole and the symbicort 80 into the pts pharmacy.  I have called and LM on her VM to make her aware.

## 2020-06-09 NOTE — Telephone Encounter (Signed)
Called and spoke with patient regarding PA for Pantoprazole.  Advised that back in August it was denied because she had not tried any of the medications on the formulary.  Advised that Dr. Sherene Sires had stated to take Omeprazole 40 mg Take 30- 60 min before your first and last meals of the day  X 3 months.  She is also requesting a refill of her Symbicort.  Advised I would send in a refill for to her pharmacy.  She verbalized understanding.  Dr. Sherene Sires, The patient contacted the office regarding the PA and I informed her of the results of the PA and your recommendations.  Please advise if ok to send script for Omeprazole 40 mg to the pharmacy.    I just attempted to refill her Symbicort 160-4.5 and reached a message that it was not on her insurance formulary.  On the formulary is:  Advair diskus 100-50, 250-50, 500-50; fluticasone-saletrerol 55-14, 113-14, 232-14, Advair HFA 45-21, 115-21, 230-21; Symbicort 80-4.5; combivent 20-100; Breo 100-25, 200-25: anoro 62-5-25; stiolto 2.5-2.5; Trelegy 100-62.5-25; 200-62.5-25; breztri 160-9-4.8.  She has an upcoming appointment in January.  Thank you.

## 2020-06-12 DIAGNOSIS — N760 Acute vaginitis: Secondary | ICD-10-CM | POA: Diagnosis not present

## 2020-06-12 DIAGNOSIS — B373 Candidiasis of vulva and vagina: Secondary | ICD-10-CM | POA: Diagnosis not present

## 2020-06-22 DIAGNOSIS — J019 Acute sinusitis, unspecified: Secondary | ICD-10-CM | POA: Diagnosis not present

## 2020-07-21 ENCOUNTER — Ambulatory Visit: Payer: BC Managed Care – PPO | Admitting: Internal Medicine

## 2020-07-21 ENCOUNTER — Encounter: Payer: Self-pay | Admitting: Internal Medicine

## 2020-07-21 ENCOUNTER — Other Ambulatory Visit: Payer: Self-pay

## 2020-07-21 ENCOUNTER — Ambulatory Visit (INDEPENDENT_AMBULATORY_CARE_PROVIDER_SITE_OTHER): Payer: BC Managed Care – PPO

## 2020-07-21 DIAGNOSIS — J449 Chronic obstructive pulmonary disease, unspecified: Secondary | ICD-10-CM | POA: Diagnosis not present

## 2020-07-21 DIAGNOSIS — F17200 Nicotine dependence, unspecified, uncomplicated: Secondary | ICD-10-CM | POA: Diagnosis not present

## 2020-07-21 DIAGNOSIS — J45909 Unspecified asthma, uncomplicated: Secondary | ICD-10-CM | POA: Diagnosis not present

## 2020-07-21 DIAGNOSIS — R059 Cough, unspecified: Secondary | ICD-10-CM | POA: Diagnosis not present

## 2020-07-21 MED ORDER — CEFDINIR 300 MG PO CAPS: 300.0000 mg | ORAL_CAPSULE | Freq: Two times a day (BID) | ORAL | 0 refills | Status: DC

## 2020-07-21 NOTE — Progress Notes (Signed)
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 Some better on saba / symbicort though not adherent and worsening doe since early 2020 so referred to pulmonary clinic 05/04/2019 by Dr   [name].  In 2015 wt around 220      History of Present Illness  05/04/2019  Pulmonary/ 1st office eval/Carlyon Nolasco  Chief Complaint  Patient presents with  . Pulmonary Consult    Referred by Dr [name]. Pt c/o SOB "for years"- worse over the past several months. She also c/o cough with yellow sputum.  Dyspnea:  MMRC1 = can walk nl pace, flat grade, can't hurry or go uphills or steps s sob   Cough: worse in am's lots yellow mucus  Sleep: on cpap per Dr Phillips Odor  SABA use: has taken po, hfa and neb  rec Plan A = Automatic = Always=    Symbicort 80 Take 2 puffs first thing in am and then another 2 puffs about 12 hours later.   Work on inhaler technique:  relax and gently blow all the way out then take a nice smooth deep breath back in, triggering the inhaler at same time you start breathing in.  Prednisone 10 mg take  4 each am x 2 days,   2 each am x 2 days,  1 each am x 2 days and stop  Zpak Work on inhaler technique:  Plan B = Backup (to supplement plan A, not to replace it) Only use your albuterol inhaler as a rescue medication Plan C = Crisis (instead of Plan B but only if Plan B stops working) - only use your albuterol nebulizer if you first try Plan B and it fails to help > ok to use the nebulizer up to every 4 hours but if start needing it regularly call for immediate appointment Pantoprazole (protonix) 40 mg   Take  30-60 min before first meal of the day and Pepcid (famotidine)  20 mg one @  bedtime until return to office - GERD diet  The key is to stop smoking completely before smoking completely stops  you!   06/02/2019  f/u ov/Bren Steers re: AB/ still smoking /still hoarse on symb 160 and not taking ppi ac qam Chief Complaint  Patient presents with  . Follow-up    Breathing has improved since the last visit. She is coughing less and her sputum is white to green/yellow. She is using her albuterol inhaler 1-2 x per day and has only used neb x 2 since her last visit.   Dyspnea:  MMRC1 = can walk nl pace, flat grade, can't hurry or go uphills or steps s sob   Cough: much less severe but worse in am  Min productive white mucus Sleeping: on cpap per Phillips Odor  SABA use: much less  02: none  rec For cough > mucinex dm 1200 mg every 12 hours as needed  Try lower strength symbicort = 80 Take 2 puffs first thing in am and then another 2 puffs about 12 hours later.  GERD  diet The key is to stop smoking completely before smoking completely stops you! Please schedule a follow up office visit in 6 weeks, call sooner if needed    07/23/19 virtual recs rec Augmentin 875 mg take one pill twice daily  X 10 days -   For cough, nasal congestion > mucinex dm 1200 up to one every 12 hours as needed  The key is to stop smoking completely before smoking completely stops you! Work on inhaler technique:    Please schedule a follow up visit in 3 months but call sooner if needed  with all medications /inhalers/ solutions in hand so we can verify exactly what you are taking. This includes all medications from all doctors and over the counters    07/21/2020  f/u ov/Indio Santilli re: still smoking/ never saw [place], nasal symptoms no better, maint on symb 80 2bid   Chief Complaint  Patient presents with  . Follow-up    Cough has improved some but still bothersome. Her cough is prod with yellow to green sputum, esp in them. She is using her albuterol inhaler 5 x per day.    Dyspnea:  MMRC1 = can walk nl pace, flat grade, can't hurry or go uphills or steps s sob  Cough: esp in am tend to produce yellow mucus assoc with nasal  congestion  Sleeping: on side/on cpap bed is flat 2 pillows no inhalers needed  SABA use: over use  02: none     No obvious day to day or daytime variability or assoc excess/ purulent sputum or mucus plugs or hemoptysis or cp or chest tightness, subjective wheeze or overt sinus or hb symptoms.   Sleeping  without nocturnal   exacerbation  of respiratory  c/o's or need for noct saba. Also denies any obvious fluctuation of symptoms with weather or environmental changes or other aggravating or alleviating factors except as outlined above   No unusual exposure hx or h/o childhood pna/ asthma or knowledge of premature birth.  Current Allergies, Complete Past Medical History, Past Surgical History, Family History, and Social History were reviewed in [employer] record.  ROS  The following are not active complaints unless bolded Hoarseness, sore throat, dysphagia, dental problems, itching, sneezing,  nasal congestion or discharge of excess mucus or purulent secretions, ear ache,   fever, chills, sweats, unintended wt loss or wt gain, classically pleuritic or exertional cp,  orthopnea pnd or arm/hand swelling  or leg swelling, presyncope, palpitations, abdominal pain, anorexia, nausea, vomiting, diarrhea  or change in bowel habits or change in bladder habits, change in stools or change in urine, dysuria, hematuria,  rash, arthralgias, visual complaints, headache, numbness, weakness or ataxia or problems with walking or coordination,  change in mood or  memory.        Current Meds  Medication Sig  . albuterol (ACCUNEB) 1.25 MG/3ML nebulizer solution Take 1 ampule by nebulization every 4 (four) hours as needed for wheezing.  [name] albuterol (VENTOLIN HFA) 108 (90 Base) MCG/ACT inhaler INHALE 1 TO 2 PUFFS INTO LUNGS EVERY 4 HOURS AS NEEDED  . budesonide-formoterol (SYMBICORT) 80-4.5 MCG/ACT inhaler Inhale 2 puffs into the lungs in the morning and at bedtime.  . fenofibrate 160 MG  tablet Take 160 mg by mouth daily.   [name] ibuprofen (ADVIL,MOTRIN) 800 MG tablet Take 1 tablet (800 mg total) by mouth 3 (three) times daily.  . metFORMIN (GLUCOPHAGE) 1000 MG tablet metformin 1,000 mg tablet  TAKE 1 TABLET BY MOUTH TWICE DAILY (WITH MORNING AND EVENING MEAL)  . omeprazole (PRILOSEC) 40 MG capsule Take 1 capsule (40 mg total) by mouth in the morning and at bedtime. Take 30-60 mins before breakfast and dinner  Past Medical History:  Diagnosis Date  . Allergic rhinitis   . Asthma   . Depression   . Diabetes mellitus without complication (HCC)   . OSA on CPAP          Objective:          07/21/2020      197   06/02/19 207 lb (93.9 kg)  05/04/19 206 lb (93.4 kg)  04/06/19 207 lb (93.9 kg)    Vital signs reviewed  07/21/2020  - Note at rest 02 sats  97% on RA   General appearance:    Obese talkative pleasant wf nad      HEENT : pt wearing mask not removed for exam due to covid -19 concerns.    NECK :  without JVD/Nodes/TM/ nl carotid upstrokes bilaterally   LUNGS: no acc muscle use,  Nl contour chest which is perfecty  clear to A and P bilaterally without cough on insp or exp maneuvers   CV:  RRR  no s3 or murmur or increase in P2, and no edema   ABD:  soft and nontender with nl inspiratory excursion in the supine position. No bruits or organomegaly appreciated, bowel sounds nl  MS:  Nl gait/ ext warm without deformities, calf tenderness, cyanosis or clubbing No obvious joint restrictions   SKIN: warm and dry without lesions    NEURO:  alert, approp, nl sensorium with  no motor or cerebellar deficits apparent.   CXR PA and Lateral:   07/21/2020 :    I personally reviewed images and  impression as follows:   wnl         Assessment

## 2020-07-21 NOTE — Patient Instructions (Addendum)
For cough  >  mucinex dm 1200 mg every 12 hours as needed   omnicef 300 mg twice daily x 10 days   Follow up with Dr Suszanne Conners to see if you need to be treated for reflux   Please remember to go to the  x-ray department  for your tests - we will call you with the results when they are available     Follow up is in  as needed

## 2020-07-22 ENCOUNTER — Encounter: Payer: Self-pay | Admitting: Internal Medicine

## 2020-07-22 NOTE — Assessment & Plan Note (Signed)
Counseled re importance of smoking cessation but did not meet time criteria for separate billing           Each maintenance medication was reviewed in detail including emphasizing most importantly the difference between maintenance and prns and under what circumstances the prns are to be triggered using an action plan format where appropriate.  Total time for H and P, chart review, counseling, reviewing hfa  device(s) and generating customized AVS unique to this office visit / same day charting = 25 min       

## 2020-07-22 NOTE — Assessment & Plan Note (Addendum)
Active smoker  - Alpha one AT screen 05/04/2019   Level 178  MM - Eos 0.7 05/04/2019  With IgE 347  - 05/04/2019  After extensive coaching inhaler device,  effectiveness =    75% > continue symb 80 2bid but take it more consistently > continued 160 - 06/02/2019 changed to symb 80 2bid due to hoarseness  - 06/02/2019   Walked RA x two laps =  approx 571ft @ moderate pace - stopped due to end of study  with sats of 97 at the end of the study and min sob   Chronically poorly controlled and over using saba for a number of reasons  1) active smoking (see separate a/p)   2) chronic rhinosinusitis >  rx omnicef x 10 days (denies pcn allergy though still listed and asked to consult her pharmacist for last time she took augmentin for our records) and consider allergy w/u p Teoh eval complete  3)  Acid and non acid GERD > continue prilosec  Ac and diet/ ask Dr Suszanne Conners to comment on this as well at planned f/u exam.  4) misunderstanding of how/ when to use saba I spent extra time with pt today reviewing appropriate use of albuterol for prn use on exertion with the following points: 1) saba is for relief of sob that does not improve by walking a slower pace or resting but rather if the pt does not improve after trying this first. 2) If the pt is convinced, as many are, that saba helps recover from activity faster then it's easy to tell if this is the case by re-challenging : ie stop, take the inhaler, then p 5 minutes try the exact same activity (intensity of workload) that just caused the symptoms and see if they are substantially diminished or not after saba 3) if there is an activity that reproducibly causes the symptoms, try the saba 15 min before the activity on alternate days  If in fact the saba really does help, then fine to continue to use it prn but advised may need to look closer at the maintenance regimen being used to achieve better control of airways disease with exertion.    Pulmonary f/u can be  prn in the [place] office

## 2020-08-11 ENCOUNTER — Other Ambulatory Visit: Payer: Self-pay | Admitting: Internal Medicine

## 2020-08-14 DIAGNOSIS — B373 Candidiasis of vulva and vagina: Secondary | ICD-10-CM | POA: Diagnosis not present

## 2020-08-14 DIAGNOSIS — L723 Sebaceous cyst: Secondary | ICD-10-CM | POA: Diagnosis not present

## 2020-08-14 DIAGNOSIS — N764 Abscess of vulva: Secondary | ICD-10-CM | POA: Diagnosis not present

## 2020-08-24 DIAGNOSIS — L723 Sebaceous cyst: Secondary | ICD-10-CM | POA: Diagnosis not present

## 2021-04-30 DIAGNOSIS — R8290 Unspecified abnormal findings in urine: Secondary | ICD-10-CM | POA: Diagnosis not present

## 2021-04-30 DIAGNOSIS — N760 Acute vaginitis: Secondary | ICD-10-CM | POA: Diagnosis not present

## 2021-04-30 DIAGNOSIS — L292 Pruritus vulvae: Secondary | ICD-10-CM | POA: Diagnosis not present

## 2021-05-09 DIAGNOSIS — B372 Candidiasis of skin and nail: Secondary | ICD-10-CM | POA: Diagnosis not present

## 2021-05-09 DIAGNOSIS — L400 Psoriasis vulgaris: Secondary | ICD-10-CM | POA: Diagnosis not present

## 2021-05-09 DIAGNOSIS — L732 Hidradenitis suppurativa: Secondary | ICD-10-CM | POA: Diagnosis not present

## 2021-05-18 DIAGNOSIS — B372 Candidiasis of skin and nail: Secondary | ICD-10-CM | POA: Diagnosis not present

## 2021-05-23 DIAGNOSIS — J069 Acute upper respiratory infection, unspecified: Secondary | ICD-10-CM | POA: Diagnosis not present

## 2021-06-06 DIAGNOSIS — J449 Chronic obstructive pulmonary disease, unspecified: Secondary | ICD-10-CM | POA: Diagnosis not present

## 2021-06-06 DIAGNOSIS — E1165 Type 2 diabetes mellitus with hyperglycemia: Secondary | ICD-10-CM | POA: Diagnosis not present

## 2021-06-06 DIAGNOSIS — J22 Unspecified acute lower respiratory infection: Secondary | ICD-10-CM | POA: Diagnosis not present

## 2021-06-07 ENCOUNTER — Encounter: Payer: Self-pay | Admitting: Internal Medicine

## 2021-06-07 ENCOUNTER — Ambulatory Visit (INDEPENDENT_AMBULATORY_CARE_PROVIDER_SITE_OTHER): Payer: BC Managed Care – PPO | Admitting: Internal Medicine

## 2021-06-07 ENCOUNTER — Ambulatory Visit (INDEPENDENT_AMBULATORY_CARE_PROVIDER_SITE_OTHER): Payer: BC Managed Care – PPO

## 2021-06-07 ENCOUNTER — Other Ambulatory Visit: Payer: Self-pay

## 2021-06-07 DIAGNOSIS — R059 Cough, unspecified: Secondary | ICD-10-CM | POA: Diagnosis not present

## 2021-06-07 DIAGNOSIS — J449 Chronic obstructive pulmonary disease, unspecified: Secondary | ICD-10-CM

## 2021-06-07 DIAGNOSIS — F17210 Nicotine dependence, cigarettes, uncomplicated: Secondary | ICD-10-CM

## 2021-06-07 DIAGNOSIS — F17200 Nicotine dependence, unspecified, uncomplicated: Secondary | ICD-10-CM

## 2021-06-07 MED ORDER — HYDROCODONE-ACETAMINOPHEN 5-325 MG PO TABS: 1.0000 | ORAL_TABLET | ORAL | 0 refills | Status: DC | PRN

## 2021-06-07 MED ORDER — SYMBICORT 80-4.5 MCG/ACT IN AERO: INHALATION_SPRAY | RESPIRATORY_TRACT | 5 refills | Status: DC

## 2021-06-07 MED ORDER — CEFDINIR 300 MG PO CAPS
300.0000 mg | ORAL_CAPSULE | Freq: Two times a day (BID) | ORAL | 0 refills | Status: DC
Start: 2021-06-07 — End: 2021-07-20

## 2021-06-07 MED ORDER — METHYLPREDNISOLONE ACETATE 80 MG/ML IJ SUSP
120.0000 mg | Freq: Once | INTRAMUSCULAR | Status: AC
Start: 2021-06-07 — End: 2021-06-07
  Administered 2021-06-07: 120 mg via INTRAMUSCULAR

## 2021-06-07 NOTE — Patient Instructions (Addendum)
The key is to stop smoking completely before smoking completely stops you!  Depomedrol 120 mg IM today  For cough > mucinex dm 1200 mg twice daily with water for cough and supplement with norco one every 4 hours if still coughing   Start sulfa  one twice daily with glass of water (and if not better then fill the rx for omnicef and f/u with Dr Suszanne Conners as discussed.  Try prilosec otc 20mg   Take 30-60 min before first meal of the day and Pepcid ac (famotidine) 20 mg one @  bedtime until cough is completely gone for at least a week without the need for cough medication   GERD (REFLUX)  is an extremely common cause of respiratory symptoms just like yours , many times with no obvious heartburn at all.    It can be treated with medication, but also with lifestyle changes including elevation of the head of your bed (ideally with 6 -8inch blocks under the headboard of your bed),  Smoking cessation, avoidance of late meals, excessive alcohol, and avoid fatty foods, chocolate, peppermint, colas, red wine, and acidic juices such as orange juice.  NO MINT OR MENTHOL PRODUCTS SO NO COUGH DROPS  USE SUGARLESS CANDY INSTEAD (Jolley ranchers or Stover's or Life Savers) or even ice chips will also do - the key is to swallow to prevent all throat clearing. NO OIL BASED VITAMINS - use powdered substitutes.  Avoid fish oil when coughing.   Plan A = Automatic = Always=    Symbicort 80 Take 2 puffs first thing in am and then another 2 puffs about 12 hours later.     Plan B = Backup (to supplement plan A, not to replace it) Only use your albuterol inhaler as a rescue medication to be used if you can't catch your breath by resting or doing a relaxed purse lip breathing pattern.  - The less you use it, the better it will work when you need it. - Ok to use the inhaler up to 2 puffs  every 4 hours if you must but call for appointment if use goes up over your usual need - Don't leave home without it !!  (think of it like  the spare tire for your car)   Plan C = Crisis (instead of Plan B but only if Plan B stops working) - only use your albuterol nebulizer if you first try Plan B and it fails to help > ok to use the nebulizer up to every 4 hours but if start needing it regularly call for immediate appointment   Please remember to go to the  x-ray department  for your tests - we will call you with the results when they are available     Please schedule a follow up office visit in 6 weeks, call sooner if needed in the [place] office with all active medications and inhalers

## 2021-06-07 NOTE — Progress Notes (Signed)
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 Some better on saba / symbicort though not adherent and worsening doe since early 2020 so referred to pulmonary clinic 05/04/2019 by Dr   [name].  In 2015 wt around 220    History of Present Illness  05/04/2019  Pulmonary/ 1st office eval/Kristin Austin  Chief Complaint  Patient presents with   Pulmonary Consult    Referred by Dr [name]. Pt c/o SOB "for years"- worse over the past several months. She also c/o cough with yellow sputum.  Dyspnea:  MMRC1 = can walk nl pace, flat grade, can't hurry or go uphills or steps s sob   Cough: worse in am's lots yellow mucus  Sleep: on cpap per Dr Phillips Odor  SABA use: has taken po, hfa and neb  rec Plan A = Automatic = Always=    Symbicort 80 Take 2 puffs first thing in am and then another 2 puffs about 12 hours later.   Work on inhaler technique:  relax and gently blow all the way out then take a nice smooth deep breath back in, triggering the inhaler at same time you start breathing in.  Prednisone 10 mg take  4 each am x 2 days,   2 each am x 2 days,  1 each am x 2 days and stop  Zpak Work on inhaler technique:  Plan B = Backup (to supplement plan A, not to replace it) Only use your albuterol inhaler as a rescue medication Plan C = Crisis (instead of Plan B but only if Plan B stops working) - only use your albuterol nebulizer if you first try Plan B and it fails to help > ok to use the nebulizer up to every 4 hours but if start needing it regularly call for immediate appointment Pantoprazole (protonix) 40 mg   Take  30-60 min before first meal of the day and Pepcid (famotidine)  20 mg one @  bedtime until return to office - GERD diet  The key is to stop smoking completely before smoking completely stops you!       07/21/2020  f/u ov/Kristin Austin re: still smoking/  nasal symptoms no better, maint on symb 80 2bid  had rec Teoh eval not done  Chief Complaint  Patient presents with   Follow-up    Cough has improved some but still bothersome. Her cough is prod with yellow to green sputum, esp in them. She is using her albuterol inhaler 5 x per day.    Dyspnea:  MMRC1 = can walk nl pace, flat grade, can't hurry or go uphills or steps s sob    Cough: esp in am tend to produce yellow mucus assoc with nasal congestion  Sleeping: on side/on cpap bed is flat 2 pillows no inhalers needed  SABA use: over use  02: none  Rec For cough  >  mucinex dm 1200 mg every 12 hours as needed  omnicef 300 mg twice daily x 10 days  Follow up with Dr Suszanne Conners to see if you need to be treated for reflux   > never saw in  [facility] wnl     06/07/2021  f/u ov/Kristin Austin re: AB/ still smoking  maint on nothing with cc cough x 2016 much worse since Tgiving  Chief Complaint  Patient presents with   Follow-up    Pt states cough since thanksgiving see some phlegm   Dyspnea:  adl's were a challenge even before "caught a cold" around [place]  Cough:  extremely harsh mucus is yellow to green darker in am  rx with bactrim by PCP but never took it  Sleeping: on side/ bed  is flat with 3 pillows  SABA use: some better with hfa/ neb helped more  02: none  Covid status:   vax x 2 / not infected    No obvious patterns in day to day or daytime variability or assoc  r mucus plugs or hemoptysis or cp or chest tightness, subjective wheeze or overt sinus or hb symptoms.     Also denies any obvious fluctuation of symptoms with weather or environmental changes or other aggravating or alleviating factors except as outlined above   No unusual exposure hx or h/o childhood pna/ asthma or knowledge of premature birth.  Current Allergies, Complete Past Medical History, Past Surgical History, Family History, and Social History were reviewed in [facility] record.  ROS  The following are not active complaints unless bolded Hoarseness, sore throat, dysphagia, dental problems, itching, sneezing,  nasal congestion or discharge of excess mucus or purulent secretions, ear ache,   fever, chills, sweats, unintended wt loss or wt gain, classically pleuritic or exertional cp,  orthopnea pnd or arm/hand swelling  or leg swelling, presyncope, palpitations, abdominal pain, anorexia, nausea, vomiting, diarrhea  or change in bowel habits or change in bladder habits, change in stools or change in urine, dysuria, hematuria,  rash, arthralgias, visual complaints, headache, numbness, weakness or ataxia or problems with walking or coordination,  change in mood or  memory.        Current Meds  Medication Sig   albuterol (ACCUNEB) 1.25 MG/3ML nebulizer solution Take 1 ampule by nebulization every 4 (four) hours as needed for wheezing.   albuterol (VENTOLIN HFA) 108 (90 Base) MCG/ACT inhaler INHALE 1 TO 2 PUFFS INTO LUNGS EVERY 4 HOURS AS NEEDED   ibuprofen (ADVIL,MOTRIN) 800 MG tablet Take 1 tablet (800 mg total) by mouth 3 (three) times daily.   metFORMIN (GLUCOPHAGE) 1000 MG tablet metformin 1,000 mg tablet  TAKE 1 TABLET BY MOUTH TWICE DAILY (WITH MORNING AND EVENING MEAL)                Past Medical History:  Diagnosis Date   Allergic rhinitis    Asthma    Depression    Diabetes mellitus without complication (HCC)    OSA on CPAP          Objective:      wts  06/07/2021       197   07/21/2020      197   06/02/19 207 lb (93.9 kg)  05/04/19 206 lb (93.4 kg)  04/06/19 207 lb (93.9 kg)     Vital signs reviewed  06/07/2021  - Note at rest 02 sats  96% on RA   General appearance:    amb wf  somewhat of a hopeless /helpless affect / somewhat tearful with extremely harsh upper airway pattern coughing fits    HEENT : pt wearing mask not removed for exam due to covid -19 concerns.    NECK :  without JVD/Nodes/TM/ nl carotid  upstrokes bilaterally   LUNGS: no acc muscle use,  Nl contour chest which is clear to A and P bilaterally without cough on insp  or exp maneuvers   CV:  RRR  no s3 or murmur or increase in P2, and no edema   ABD:  soft and nontender with nl inspiratory excursion in the supine position. No bruits or organomegaly appreciated, bowel sounds nl  MS:  Nl gait/ ext warm without deformities, calf tenderness, cyanosis or clubbing No obvious joint restrictions   SKIN: warm and dry without lesions    NEURO:  alert, approp, nl sensorium with  no motor or cerebellar deficits apparent.   CXR PA and Lateral:   06/07/2021 :    I personally reviewed images and agree with radiology impression as follows:     mild bronchitic lung changes without focal airspace consolidation      Assessment

## 2021-06-07 NOTE — Assessment & Plan Note (Addendum)
Active smoker  - Alpha one AT screen 05/04/2019   Level 178  MM - Eos 0.7 05/04/2019  With IgE 347  - 05/04/2019  After extensive coaching inhaler device,  effectiveness =    75% > continue symb 80 2bid but take it more consistently > continued 160 - 06/02/2019 changed to symb 80 2bid due to hoarseness  - 06/02/2019   Walked RA x two laps =  approx 514ft @ moderate pace - stopped due to end of study  with sats of 97 at the end of the study and min sob   Acute flare since just before tgiving   rx Take the bactrim since already paid for it  Start mucinex dm 1200 mg every 12 hours and supplement with narco to eliminate cyclical coughing Max rx for gerd Continue low doses of symbicort since less likely to aggravate cough and also depomedro 120 mg IM today >>>   in case of component of Th-2 driven upper or lower airways inflammation (if cough responds short term only to relapse before return while will on full rx for uacs (as above), then  that would point to allergic rhinitis/ asthma or eos bronchitis as alternative dx)   - The proper method of use, as well as anticipated side effects, of a metered-dose inhaler were discussed and demonstrated to the patient using teach back method   - Teoh eval if nasal symptoms persist   F/u in 6 weeks in the [place] office with all meds in hand using a trust but verify approach to confirm accurate Medication  Reconciliation The principal here is that until we are certain that the  patients are doing what we've asked, it makes no sense to ask them to do more.          Each maintenance medication was reviewed in detail including emphasizing most importantly the difference between maintenance and prns and under what circumstances the prns are to be triggered using an action plan format where appropriate.  Total time for H and P, chart review, counseling, reviewing hfa device(s) and generating customized AVS unique to this office visit / same day charting = 30  min

## 2021-06-08 ENCOUNTER — Encounter: Payer: Self-pay | Admitting: Internal Medicine

## 2021-06-08 NOTE — Assessment & Plan Note (Signed)
4  min discussion re active cigarette smoking in addition to office E&M  Ask about tobacco use:   Ongoing despite severe chronic cough  Advise quitting   I emphasized that although we never turn away smokers from the pulmonary clinic, we do ask that they understand that the recommendations that we make  won't work nearly as well in the presence of continued cigarette exposure. In fact, we may very well  reach a point where we can't promise to help the patient if he/she can't quit smoking. (We can and will promise to try to help, we just can't promise what we recommend will really work)  Assess willingness:  Not committed at this point Assist in quit attempt:  Per PCP when ready Arrange follow up:   Follow up per Primary Care planned

## 2021-06-19 ENCOUNTER — Telehealth: Payer: Self-pay | Admitting: Internal Medicine

## 2021-06-19 NOTE — Telephone Encounter (Signed)
Call made to patient, confirmed DOB. Patient was seen 12/8 and was told to start mucinex, sulfa, and prilosec. She confirms she has been using these daily. She reports her cough is better but she still coughing a lot. She states she is now sneezing and has a lot of congestion. She is wanting to know if she needs to start a allergy pill. She does report nasal congestion, sneezing and head stuffiness. She developed a bad headache right before developing all the congestion. She confirms she is using her Breztri daily and albuterol prn. She has not needed her nebulizer.   TP please advise. Thanks :)

## 2021-06-19 NOTE — Telephone Encounter (Signed)
Yes can try Allegra 180mg  daily As needed  drainage   Please contact office for sooner follow up if symptoms do not improve or worsen or seek emergency care

## 2021-06-19 NOTE — Telephone Encounter (Signed)
I called and spoke with the pt and notified of response per Tammy She verbalized understanding and nothing further needed

## 2021-07-20 ENCOUNTER — Other Ambulatory Visit: Payer: Self-pay

## 2021-07-20 ENCOUNTER — Ambulatory Visit: Payer: BC Managed Care – PPO | Admitting: Internal Medicine

## 2021-07-20 ENCOUNTER — Encounter: Payer: Self-pay | Admitting: Internal Medicine

## 2021-07-20 DIAGNOSIS — J449 Chronic obstructive pulmonary disease, unspecified: Secondary | ICD-10-CM | POA: Diagnosis not present

## 2021-07-20 DIAGNOSIS — J310 Chronic rhinitis: Secondary | ICD-10-CM

## 2021-07-20 MED ORDER — METHYLPREDNISOLONE ACETATE 80 MG/ML IJ SUSP
120.0000 mg | Freq: Once | INTRAMUSCULAR | Status: AC
  Administered 2021-07-20: 120 mg via INTRAMUSCULAR

## 2021-07-20 MED ORDER — PANTOPRAZOLE SODIUM 40 MG PO TBEC: 40.0000 mg | DELAYED_RELEASE_TABLET | Freq: Every day | ORAL | 2 refills | Status: DC

## 2021-07-20 MED ORDER — FAMOTIDINE 20 MG PO TABS: ORAL_TABLET | ORAL | 11 refills | Status: DC

## 2021-07-20 MED ORDER — MONTELUKAST SODIUM 10 MG PO TABS: ORAL_TABLET | ORAL | 2 refills | Status: DC

## 2021-07-20 NOTE — Patient Instructions (Addendum)
Prilosec 20 mg x 2 or Pantoprazole (protonix) 40 mg   Take  30-60 min before first meal of the day and Pepcid (famotidine)  20 mg after supper until return to office - this is the best way to tell whether stomach acid is contributing to your problem.    Depomedrol 120 mg IM   Singulair  10 mg one daily take in evening   See Dr Benjamine Mola then we will be referring you to allergy next   For cough or throat clearing >  mucinex dm up to 1200 mg every 12 hours as needed  with the hydrocodone up to every 4 hours if needed   For nasal congestion / drainage issues > allergra or zyrtec over the counter as needed   Stop the mint candy !  [place] in 4-6 weeks - please bring all your medications

## 2021-07-20 NOTE — Addendum Note (Signed)
Addended by: Christen Butter on: 07/20/2021 04:48 PM   Modules accepted: Orders

## 2021-07-20 NOTE — Assessment & Plan Note (Addendum)
Quit 07/2021  - Alpha one AT screen 05/04/2019   Level 178  MM - Eos 0.7 05/04/2019  With IgE 347  - 05/04/2019  After extensive coaching inhaler device,  effectiveness =    75% > continue symb 80 2bid but take it more consistently > continued 160 - 06/02/2019 changed to symb 80 2bid due to hoarseness  - 06/02/2019   Walked RA x two laps =  approx 548f @ moderate pace - stopped due to end of study  with sats of 97 at the end of the study and min sob   07/20/2021  After extensive coaching inhaler device,  effectiveness =    75% (short Ti)  > continue symb 80 2bid and add singulair for rhinitis (see sep a/p)  In terms of the actual asthma component, I believe All goals of chronic asthma control met including optimal function and elimination of symptoms with minimal need for rescue therapy.  Contingencies discussed in full including contacting this office immediately if not controlling the symptoms using the rule of two's.

## 2021-07-20 NOTE — Progress Notes (Signed)
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 Some better on saba / symbicort though not adherent and worsening doe since early 2020 so referred to pulmonary clinic 05/04/2019 by Dr   Kristin Austin.  In 2015 wt around 220    History of Present Illness  05/04/2019  Pulmonary/ 1st office eval/Kristin Austin  Chief Complaint  Patient presents with   Pulmonary Consult    Referred by Dr Kristin Austin. Pt c/o SOB "for years"- worse over the past several months. She also c/o cough with yellow sputum.  Dyspnea:  MMRC1 = can walk nl pace, flat grade, can't hurry or go uphills or steps s sob   Cough: worse in am's lots yellow mucus  Sleep: on cpap per Dr Kristin Austin  SABA use: has taken po, hfa and neb  rec Plan A = Automatic = Always=    Symbicort 80 Take 2 puffs first thing in am and then another 2 puffs about 12 hours later.   Work on inhaler technique:  relax and gently blow all the way out then take a nice smooth deep breath back in, triggering the inhaler at same time you start breathing in.  Prednisone 10 mg take  4 each am x 2 days,   2 each am x 2 days,  1 each am x 2 days and stop  Zpak Work on inhaler technique:  Plan B = Backup (to supplement plan A, not to replace it) Only use your albuterol inhaler as a rescue medication Plan C = Crisis (instead of Plan B but only if Plan B stops working) - only use your albuterol nebulizer if you first try Plan B and it fails to help > ok to use the nebulizer up to every 4 hours but if start needing it regularly call for immediate appointment Pantoprazole (protonix) 40 mg   Take  30-60 min before first meal of the day and Pepcid (famotidine)  20 mg one @  bedtime until return to office - GERD diet  The key is to stop smoking completely before smoking completely stops you!       07/21/2020  f/u ov/Kristin Austin re: still smoking/  nasal symptoms no better, maint on symb 80 2bid  had rec Kristin Austin eval not done  Chief Complaint  Patient presents with   Follow-up    Cough has improved some but still bothersome. Her cough is prod with yellow to green sputum, esp in them. She is using her albuterol inhaler 5 x per day.    Dyspnea:  MMRC1 = can walk nl pace, flat grade, can't hurry or go uphills or steps s sob    Cough: esp in am tend to produce yellow mucus assoc with nasal congestion  Sleeping: on side/on cpap bed is flat 2 pillows no inhalers needed  SABA use: over use  02: none  Rec For cough  >  mucinex dm 1200 mg every 12 hours as needed  omnicef 300 mg twice daily x 10 days  Follow up with Dr Kristin Austin to see if you need to be treated for reflux   > never saw in  Cxr wnl     06/07/2021  f/u ov/Kristin Austin re: AB/ still smoking  maint on nothing with cc cough x 2016 much worse since Tgiving  Chief Complaint  Patient presents with   Follow-up    Pt states cough since thanksgiving see some phlegm   Dyspnea:  adl's were a challenge even before "caught a cold" around Tgiving Cough:  extremely harsh mucus is yellow to green darker in am  rx with bactrim by PCP but never took it  Sleeping: on side/ bed  is flat with 3 pillows  SABA use: some better with hfa/ neb helped more  02: none  Covid status:   vax x 2 / not infected  Rec The key is to stop smoking completely before smoking completely stops you! Depomedrol 120 mg IM today For cough > mucinex dm 1200 mg twice daily with water for cough and supplement with norco one every 4 hours if still coughing  Start sulfa  one twice daily with glass of water (and if not better then fill the rx for omnicef and f/u with Dr Kristin Austin as discussed. Try prilosec otc 20mg   Take 30-60 min before first meal of the day and Pepcid ac (famotidine) 20 mg one @  bedtime until cough is completely gone for at least a week without the need for cough  medication  GERD diet reviewed, bed blocks rec  Plan A = Automatic = Always=    Symbicort 80 Take 2 puffs first thing in am and then another 2 puffs about 12 hours later.   Plan B = Backup (to supplement plan A, not to replace it) Only use your albuterol inhaler as a rescue medication  Plan C = Crisis (instead of Plan B but only if Plan B stops working) - only use your albuterol nebulizer if you first try Plan B  Please remember to go to the  x-ray department  for your tests - we will call you with the results when they are available   Please schedule a follow up office visit in 6 weeks, call sooner if needed in the [place] office with all active medications and inhalers   07/20/2021  f/u ov/Kristin Austin re: AB/ cough x 2016  maint on symbicort 80 2bid  never stopped clearing throat, did not use norco as rec/ still using mints  Chief Complaint  Patient presents with   Follow-up    Cough is some better since the last visit, but has not resolved. She stopped smoking 12 days ago. She still coughs up yellow sputum occ and still has some wheezing. She is using her albuterol inhaler several times per wk.   Dyspnea: can do grocery store ok and cross parking lot but very sedentary  Cough: worse p eating  Sleeping: nasal cpap Dr Kristin Austin Kristin Austin blocks SABA use: rarely  02: none  Covid status:   vax x 2    No obvious day to day or daytime variability or assoc   or mucus plugs or hemoptysis or cp or chest tightness,   or overt  hb symptoms.   Sleeping  without nocturnal  or early am exacerbation  of respiratory  c/o's or need for noct saba. Also denies any obvious fluctuation of symptoms with weather or environmental changes or other aggravating or alleviating factors except as outlined above   No unusual exposure hx or h/o childhood pna/ asthma or knowledge of premature birth.  Current Allergies, Complete Past Medical History, Past Surgical History, Family History, and Social History were reviewed in  [employer] record.  ROS  The following are not active complaints unless bolded Hoarseness, sore throat, dysphagia, dental problems,  itching, sneezing,  nasal congestion or discharge of excess mucus or purulent secretions, ear ache,   fever, chills, sweats, unintended wt loss or wt gain, classically pleuritic or exertional cp,  orthopnea pnd or arm/hand swelling  or leg swelling, presyncope, palpitations, abdominal pain, anorexia, nausea, vomiting, diarrhea  or change in bowel habits or change in bladder habits, change in stools or change in urine, dysuria, hematuria,  rash, arthralgias, visual complaints, headache, numbness, weakness or ataxia or problems with walking or coordination,  change in mood or  memory.        Current Meds  Medication Sig   albuterol (ACCUNEB) 1.25 MG/3ML nebulizer solution Take 1 ampule by nebulization every 4 (four) hours as needed for wheezing.   albuterol (VENTOLIN HFA) 108 (90 Base) MCG/ACT inhaler INHALE 1 TO 2 PUFFS INTO LUNGS EVERY 4 HOURS AS NEEDED   HYDROcodone-acetaminophen (NORCO) 5-325 MG tablet Take 1 tablet by mouth every 4 (four) hours as needed for moderate pain (severe cough).   ibuprofen (ADVIL,MOTRIN) 800 MG tablet Take 1 tablet (800 mg total) by mouth 3 (three) times daily.   metFORMIN (GLUCOPHAGE) 1000 MG tablet metformin 1,000 mg tablet  TAKE 1 TABLET BY MOUTH TWICE DAILY (WITH MORNING AND EVENING MEAL)   SYMBICORT 80-4.5 MCG/ACT inhaler Take 2 puffs first thing in am and then another 2 puffs about 12 hours later.                 Past Medical History:  Diagnosis Date   Allergic rhinitis    Asthma    Depression    Diabetes mellitus without complication (HCC)    OSA on CPAP          Objective:      wts  07/20/2021       197  06/07/2021       197   07/21/2020      197   06/02/19 207 lb (93.9 kg)  05/04/19 206 lb (93.4 kg)  04/06/19 207 lb (93.9 kg)    Vital signs reviewed  07/20/2021  - Note at rest 02  sats  97% on RA   General appearance:    amb mod obese wf nad   HEENT : pt wearing mask not removed for exam due to covid -19 concerns.    NECK :  without JVD/Nodes/TM/ nl carotid upstrokes bilaterally   LUNGS: no acc muscle use,  Nl contour chest which is clear to A and P bilaterally without cough on insp or exp maneuvers   CV:  RRR  no s3 or murmur or increase in P2, and no edema   ABD: Obese  soft and nontender with nl inspiratory excursion in the supine position. No bruits or organomegaly appreciated, bowel sounds nl  MS:  Nl gait/ ext warm without deformities, calf tenderness, cyanosis or clubbing No obvious joint restrictions   SKIN: warm and dry without lesions    NEURO:  alert, approp, nl sensorium with  no motor or cerebellar deficits apparent.         Assessment

## 2021-07-20 NOTE — Assessment & Plan Note (Signed)
-   Eos 0.7 05/04/2019  With IgE 347  - [facility]oh eval 08/06/19 :  Severe nasal congestion, mild septal deviation and bilateral inferior turbinate hypertrophy - added singulair 07/20/2021    If not better p [facility] eval >allergy eval next but in meantim explained: Of the three most common causes of  Sub-acute / recurrent or chronic cough, only one (GERD)  can actually contribute to/ trigger  the other two (asthma and post nasal drip syndrome)  and perpetuate the cylce of cough.  While not intuitively obvious, many patients with chronic low grade reflux do not cough until there is a primary insult that disturbs the protective epithelial barrier and exposes sensitive nerve endings.   This is typically viral but can due to PNDS and  either may apply here.   The point is that once this occurs, it is difficult to eliminate the cycle  using anything but a maximally effective acid suppression regimen at least in the short run, accompanied by an appropriate diet to address non acid GERD and control / eliminate the cough itself for at least 3 days with norco and >>> also so added Depomerol 120 mg IM  in case of component of Th-2 driven upper or lower airways inflammation (if cough responds short term only to relapse before return while will on full rx for uacs (as above), then  that would point to allergic rhinitis/ asthma or eos bronchitis as alternative dx)           Each maintenance medication was reviewed in detail including emphasizing most importantly the difference between maintenance and prns and under what circumstances the prns are to be triggered using an action plan format where appropriate.  Total time for H and P, chart review, counseling, reviewing hfa device(s) and generating customized AVS unique to this office visit / same day charting  > 30 min

## 2021-07-26 DIAGNOSIS — Z91199 Patient's noncompliance with other medical treatment and regimen due to unspecified reason: Secondary | ICD-10-CM | POA: Diagnosis not present

## 2021-07-26 DIAGNOSIS — G4733 Obstructive sleep apnea (adult) (pediatric): Secondary | ICD-10-CM | POA: Diagnosis not present

## 2021-07-26 DIAGNOSIS — L409 Psoriasis, unspecified: Secondary | ICD-10-CM | POA: Diagnosis not present

## 2021-07-26 DIAGNOSIS — Z6834 Body mass index (BMI) 34.0-34.9, adult: Secondary | ICD-10-CM | POA: Diagnosis not present

## 2021-07-26 DIAGNOSIS — D508 Other iron deficiency anemias: Secondary | ICD-10-CM | POA: Diagnosis not present

## 2021-07-26 DIAGNOSIS — J449 Chronic obstructive pulmonary disease, unspecified: Secondary | ICD-10-CM | POA: Diagnosis not present

## 2021-07-26 DIAGNOSIS — F419 Anxiety disorder, unspecified: Secondary | ICD-10-CM | POA: Diagnosis not present

## 2021-07-26 DIAGNOSIS — J309 Allergic rhinitis, unspecified: Secondary | ICD-10-CM | POA: Diagnosis not present

## 2021-07-26 DIAGNOSIS — E6609 Other obesity due to excess calories: Secondary | ICD-10-CM | POA: Diagnosis not present

## 2021-07-26 DIAGNOSIS — E1165 Type 2 diabetes mellitus with hyperglycemia: Secondary | ICD-10-CM | POA: Diagnosis not present

## 2021-07-26 DIAGNOSIS — E781 Pure hyperglyceridemia: Secondary | ICD-10-CM | POA: Diagnosis not present

## 2021-08-23 DIAGNOSIS — B372 Candidiasis of skin and nail: Secondary | ICD-10-CM | POA: Diagnosis not present

## 2021-08-23 DIAGNOSIS — L400 Psoriasis vulgaris: Secondary | ICD-10-CM | POA: Diagnosis not present

## 2021-09-13 ENCOUNTER — Ambulatory Visit: Payer: BC Managed Care – PPO | Admitting: Internal Medicine

## 2021-09-26 DIAGNOSIS — E7800 Pure hypercholesterolemia, unspecified: Secondary | ICD-10-CM | POA: Diagnosis not present

## 2021-09-26 DIAGNOSIS — J440 Chronic obstructive pulmonary disease with acute lower respiratory infection: Secondary | ICD-10-CM | POA: Diagnosis not present

## 2021-09-26 DIAGNOSIS — E1165 Type 2 diabetes mellitus with hyperglycemia: Secondary | ICD-10-CM | POA: Diagnosis not present

## 2021-09-26 DIAGNOSIS — G4733 Obstructive sleep apnea (adult) (pediatric): Secondary | ICD-10-CM | POA: Diagnosis not present

## 2021-10-18 ENCOUNTER — Encounter: Payer: Self-pay | Admitting: Nurse Practitioner

## 2021-10-18 ENCOUNTER — Ambulatory Visit (INDEPENDENT_AMBULATORY_CARE_PROVIDER_SITE_OTHER): Payer: BC Managed Care – PPO | Admitting: Nurse Practitioner

## 2021-10-18 VITALS — BP 118/82 | HR 90 | Ht 63.0 in | Wt 202.2 lb

## 2021-10-18 DIAGNOSIS — J310 Chronic rhinitis: Secondary | ICD-10-CM

## 2021-10-18 DIAGNOSIS — D721 Eosinophilia, unspecified: Secondary | ICD-10-CM | POA: Diagnosis not present

## 2021-10-18 DIAGNOSIS — D824 Hyperimmunoglobulin E [IgE] syndrome: Secondary | ICD-10-CM

## 2021-10-18 DIAGNOSIS — J4541 Moderate persistent asthma with (acute) exacerbation: Secondary | ICD-10-CM

## 2021-10-18 LAB — POCT EXHALED NITRIC OXIDE: FeNO level (ppb): 26

## 2021-10-18 MED ORDER — SPIRIVA RESPIMAT 1.25 MCG/ACT IN AERS: 2.0000 | INHALATION_SPRAY | Freq: Every day | RESPIRATORY_TRACT | 5 refills | Status: DC

## 2021-10-18 MED ORDER — FLUTICASONE PROPIONATE 50 MCG/ACT NA SUSP: 2.0000 | Freq: Every day | NASAL | 2 refills | Status: DC

## 2021-10-18 MED ORDER — AZITHROMYCIN 250 MG PO TABS: ORAL_TABLET | ORAL | 0 refills | Status: DC

## 2021-10-18 MED ORDER — PREDNISONE 20 MG PO TABS: 20.0000 mg | ORAL_TABLET | Freq: Every day | ORAL | 0 refills | Status: AC

## 2021-10-18 MED ORDER — BENZONATATE 100 MG PO CAPS: 200.0000 mg | ORAL_CAPSULE | Freq: Three times a day (TID) | ORAL | 1 refills | Status: DC | PRN

## 2021-10-18 NOTE — Progress Notes (Signed)
? ?@Patient  ID: Kristin Austin, female    DOB: [birth year], 56 y.o.   MRN: [id number] ? ?Chief Complaint  ?Patient presents with  ? Follow-up  ? ? ?Referring provider: ?Kristin Austin, John, MD ? ?HPI: ?56 year old female, active smoker (34 pack years) followed for asthmatic bronchitis and chronic rhinitis. She is a [name] and last seen in office on 07/20/2021. Past medical history significant for obesity, uncontrolled DM, HLD.  Followed by Dr. Suszanne Connerseoh with ENT. ? ?TEST/EVENTS:  ?05/04/2019: Eosinophils 700, IgE 347 ? ?07/20/2021: OV with Dr. Sherene SiresWert. Tx for exacerbation of asthmatic bronchitis with depo 120 inj. started on Pepcid and Protonix for GERD control.  Started on Singulair at bedtime for trigger prevention.  Concern for possible upper airway component to cough as well.  Continued on Symbicort 80 twice a day.  Patient had irritation and hoarseness with Symbicort 160.  Advised to quit smoking. ? ?10/18/2021: Today-acute visit ?Patient presents today for return of bronchitic type cough.  Her symptoms started around Easter and have persisted since.  She had picked back up smoking prior to this and feels like this contributed to her symptoms.  She did quit 5 days ago when her shortness of breath had worsened.  SOB primarily upon exertion.  Describes her cough as productive with yellow sputum.  Occasionally has some wheezing as well.  Feels like her allergy symptoms are relatively well controlled.  No significant nasal congestion, sneezing or itchy eyes.  Denies hemoptysis, lower extremity edema, or recent weight loss.  She is currently on Symbicort 80, unable to tolerate 160 in the past.  She never started Singulair after her last visit as she began feeling better. ? ?Allergies  ?Allergen Reactions  ? Codeine Nausea And Vomiting  ? Sulfa Antibiotics Nausea And Vomiting  ? Penicillins Rash  ? ? ?Immunization History  ?Administered Date(s) Administered  ? PFIZER(Purple Top)SARS-COV-2 Vaccination 01/27/2020, 02/18/2020   ? ? ?Past Medical History:  ?Diagnosis Date  ? Allergic rhinitis   ? Asthma   ? Depression   ? Diabetes mellitus without complication (HCC)   ? OSA on CPAP   ? ? ?Tobacco History: ?Social History  ? ?Tobacco Use  ?Smoking Status Former  ? Packs/day: 1.00  ? Years: 34.00  ? Pack years: 34.00  ? Types: Cigarettes  ? Quit date: 07/08/2021  ? Years since quitting: 0.2  ?Smokeless Tobacco Never  ? ?Counseling given: Not Answered ? ? ?Outpatient Medications Prior to Visit  ?Medication Sig Dispense Refill  ? albuterol (ACCUNEB) 1.25 MG/3ML nebulizer solution Take 1 ampule by nebulization every 4 (four) hours as needed for wheezing.    ? albuterol (VENTOLIN HFA) 108 (90 Base) MCG/ACT inhaler INHALE 1 TO 2 PUFFS INTO LUNGS EVERY 4 HOURS AS NEEDED    ? famotidine (PEPCID) 20 MG tablet One after supper 30 tablet 11  ? HYDROcodone-acetaminophen (NORCO) 5-325 MG tablet Take 1 tablet by mouth every 4 (four) hours as needed for moderate pain (severe cough). 15 tablet 0  ? ibuprofen (ADVIL,MOTRIN) 800 MG tablet Take 1 tablet (800 mg total) by mouth 3 (three) times daily. 21 tablet 0  ? pantoprazole (PROTONIX) 40 MG tablet Take 1 tablet (40 mg total) by mouth daily. Take 30-60 min before first meal of the day 30 tablet 2  ? SYMBICORT 80-4.5 MCG/ACT inhaler Take 2 puffs first thing in am and then another 2 puffs about 12 hours later. 11 g 5  ? metFORMIN (GLUCOPHAGE) 1000 MG tablet metformin 1,000 mg  tablet ? TAKE 1 TABLET BY MOUTH TWICE DAILY (WITH MORNING AND EVENING MEAL) (Patient not taking: Reported on 10/18/2021)    ? montelukast (SINGULAIR) 10 MG tablet One at bedtime every night (Patient not taking: Reported on 10/18/2021) 30 tablet 2  ? ?No facility-administered medications prior to visit.  ? ? ? ?Review of Systems:  ? ?Constitutional: No weight loss or gain, night sweats, fevers, chills, fatigue, or lassitude. ?HEENT: No headaches, difficulty swallowing, tooth/dental problems, or sore throat. No sneezing, itching, ear ache,  nasal congestion, or post nasal drip ?CV:  No chest pain, orthopnea, PND, swelling in lower extremities, anasarca, dizziness, palpitations, syncope ?Resp: +shortness of breath with exertion; productive, bronchitic type cough, wheezing. No hemoptysis.  No chest wall deformity ?GI:  No heartburn, indigestion, abdominal pain, nausea, vomiting, diarrhea, change in bowel habits, loss of appetite, bloody stools.  ?GU: No dysuria, change in color of urine, urgency or frequency.  No flank pain, no hematuria  ?Skin: No rash, lesions, ulcerations ?MSK:  No joint pain or swelling.  No decreased range of motion.  No back pain. ?Neuro: No dizziness or lightheadedness.  ?Psych: No depression or anxiety. Mood stable.  ? ? ? ?Physical Exam: ? ?BP 118/82 (BP Location: Right Arm, Cuff Size: Normal)   Pulse 90   Ht 5\' 3"  (1.6 m)   Wt 202 lb 3.2 oz (91.7 kg)   SpO2 95%   BMI 35.82 kg/m?  ? ?GEN: Pleasant, interactive, well-appearing; in no acute distress. ?HEENT:  Normocephalic and atraumatic. EACs patent bilaterally. TM pearly gray with present light reflex bilaterally. PERRLA. Sclera white. Nasal turbinates boggy, moist and patent bilaterally.  Clear rhinorrhea present. Oropharynx erythematous and moist, without exudate or edema. No lesions, ulcerations ?NECK:  Supple w/ fair ROM. No JVD present. Normal carotid impulses w/o bruits. Thyroid symmetrical with no goiter or nodules palpated. No lymphadenopathy.   ?CV: RRR, no m/r/g, no peripheral edema. Pulses intact, +2 bilaterally. No cyanosis, pallor or clubbing. ?PULMONARY:  Unlabored, regular breathing.  Scattered end expiratory wheezes bilaterally A&P. No accessory muscle use. No dullness to percussion. ?GI: BS present and normoactive. Soft, non-tender to palpation. No organomegaly or masses detected. No CVA tenderness. ?MSK: No erythema, warmth or tenderness. Cap refil <2 sec all extrem. No deformities or joint swelling noted.  ?Neuro: A/Ox3. No focal deficits noted.   ?Skin:  Warm, no lesions or rashe ?Psych: Normal affect and behavior. Judgement and thought content appropriate.  ? ? ? ?Lab Results: ? ?CBC ?   ?Component Value Date/Time  ? WBC 9.0 05/04/2019 1020  ? RBC 4.81 05/04/2019 1020  ? HGB 14.1 05/04/2019 1020  ? HCT 41.1 05/04/2019 1020  ? PLT 292.0 05/04/2019 1020  ? MCV 85.5 05/04/2019 1020  ? MCHC 34.3 05/04/2019 1020  ? RDW 13.7 05/04/2019 1020  ? LYMPHSABS 1.5 05/04/2019 1020  ? MONOABS 0.4 05/04/2019 1020  ? EOSABS 0.7 05/04/2019 1020  ? BASOSABS 0.1 05/04/2019 1020  ? ? ?BMET ?No results found for: NA, K, CL, CO2, GLUCOSE, BUN, CREATININE, CALCIUM, GFRNONAA, GFRAA ? ?BNP ?No results found for: BNP ? ? ?Imaging: ? ?No results found. ? ? ? ?   ? View : No data to display.  ?  ?  ?  ? ? ?No results found for: NITRICOXIDE ? ? ? ? ? ?Assessment & Plan:  ? ?Asthmatic bronchitis with acute exacerbation ?Acute exacerbation with persistent cough and increased shortness of breath.  I am concerned that she has a possible COPD  component as well given smoking history.  She has been treated numerous times over the last couple months for asthma exacerbations. FeNO mildly elevated today.  Will treat with prednisone burst, lower dose due to uncontrolled blood sugars, and Z-Pak.  Advised her to notify if breathing does not improve on this.  Add Spiriva.  Check CBC with differential and IgE today.  Advised that she start Singulair given her significant elevated eosinophils in the past.  May be a candidate for biologic therapy if symptoms persist. ? ?Patient Instructions  ?Continue Symbicort 2 puffs Twice daily, brush tongue and rinse mouth afterwards ?Continue Albuterol inhaler 2 puffs or 3 mL neb every 6 hours as needed for shortness of breath or wheezing. Notify if symptoms persist despite rescue inhaler/neb use. ?Continue protonix 40 mg daily  ?Continue famotidine 20 mg daily  ?Continue CPAP nightly, minimum 4-6 hours a night  ? ?Start singulair 10 mg At bedtime  ?Start Spiriva 2 puffs  daily  ?Prednisone 20 mg for 5 days. Take in AM with food.  ?Flonase 2 sprays each nostril daily ?Benzonatate (tessalon perles) 1 capsule Three times a day as needed for cough ?Delsym 2 tsp Twice daily for coug

## 2021-10-18 NOTE — Assessment & Plan Note (Addendum)
Planning to see Dr. Suszanne Conners. Will add flonase to to target postnasal drainage. Suspect there upper airway irritation as well given exam findings. Target cough control. Continue with OTC allergy medicine and start singulair.  ?

## 2021-10-18 NOTE — Assessment & Plan Note (Signed)
Acute exacerbation with persistent cough and increased shortness of breath.  I am concerned that she has a possible COPD component as well given smoking history.  She has been treated numerous times over the last couple months for asthma exacerbations. FeNO mildly elevated today.  Will treat with prednisone burst, lower dose due to uncontrolled blood sugars, and Z-Pak.  Advised her to notify if breathing does not improve on this.  Add Spiriva.  Check CBC with differential and IgE today.  Advised that she start Singulair given her significant elevated eosinophils in the past.  May be a candidate for biologic therapy if symptoms persist. ? ?Patient Instructions  ?Continue Symbicort 2 puffs Twice daily, brush tongue and rinse mouth afterwards ?Continue Albuterol inhaler 2 puffs or 3 mL neb every 6 hours as needed for shortness of breath or wheezing. Notify if symptoms persist despite rescue inhaler/neb use. ?Continue protonix 40 mg daily  ?Continue famotidine 20 mg daily  ?Continue CPAP nightly, minimum 4-6 hours a night  ? ?Start singulair 10 mg At bedtime  ?Start Spiriva 2 puffs daily  ?Prednisone 20 mg for 5 days. Take in AM with food.  ?Flonase 2 sprays each nostril daily ?Benzonatate (tessalon perles) 1 capsule Three times a day as needed for cough ?Delsym 2 tsp Twice daily for cough ? ?Follow up in 2 weeks with Dr. Sherene Sires or Philis Nettle. If symptoms do not improve or worsen, please contact office for sooner follow up or seek emergency care. ? ? ?

## 2021-10-18 NOTE — Patient Instructions (Addendum)
Continue Symbicort 2 puffs Twice daily, brush tongue and rinse mouth afterwards ?Continue Albuterol inhaler 2 puffs or 3 mL neb every 6 hours as needed for shortness of breath or wheezing. Notify if symptoms persist despite rescue inhaler/neb use. ?Continue protonix 40 mg daily  ?Continue famotidine 20 mg daily  ?Continue CPAP nightly, minimum 4-6 hours a night  ? ?Start singulair 10 mg At bedtime  ?Start Spiriva 2 puffs daily  ?Prednisone 20 mg for 5 days. Take in AM with food.  ?Flonase 2 sprays each nostril daily ?Benzonatate (tessalon perles) 1 capsule Three times a day as needed for cough ?Delsym 2 tsp Twice daily for cough ? ?Follow up in 2 weeks with Dr. Melvyn Novas or Alanson Aly. If symptoms do not improve or worsen, please contact office for sooner follow up or seek emergency care. ?

## 2021-10-19 LAB — CBC WITH DIFFERENTIAL/PLATELET
Basophils Absolute: 0.1 10*3/uL (ref 0.0–0.1)
Basophils Relative: 0.9 % (ref 0.0–3.0)
Eosinophils Absolute: 1 10*3/uL — ABNORMAL HIGH (ref 0.0–0.7)
Eosinophils Relative: 8.8 % — ABNORMAL HIGH (ref 0.0–5.0)
HCT: 45 % (ref 36.0–46.0)
Hemoglobin: 15.2 g/dL — ABNORMAL HIGH (ref 12.0–15.0)
Lymphocytes Relative: 21.2 % (ref 12.0–46.0)
Lymphs Abs: 2.3 10*3/uL (ref 0.7–4.0)
MCHC: 33.7 g/dL (ref 30.0–36.0)
MCV: 83.5 fl (ref 78.0–100.0)
Monocytes Absolute: 0.8 10*3/uL (ref 0.1–1.0)
Monocytes Relative: 7.1 % (ref 3.0–12.0)
Neutro Abs: 6.7 10*3/uL (ref 1.4–7.7)
Neutrophils Relative %: 62 % (ref 43.0–77.0)
Platelets: 283 10*3/uL (ref 150.0–400.0)
RBC: 5.39 Mil/uL — ABNORMAL HIGH (ref 3.87–5.11)
RDW: 14.2 % (ref 11.5–15.5)
WBC: 10.7 10*3/uL — ABNORMAL HIGH (ref 4.0–10.5)

## 2021-10-19 LAB — IGE: IgE (Immunoglobulin E), Serum: 2519 kU/L — ABNORMAL HIGH (ref <=114)

## 2021-10-19 NOTE — Progress Notes (Signed)
Notified patient that IgE significantly elevated to 2519 and eosinophils 1000 indicating that allergies are a large component of her asthmatic bronchitis and recurrent exacerbations. Advised that she start on the singulair I prescribed yesterday and the prednisone burst. We discussed that she will possibly need biologic therapy to target these and explained what that would entail, which she was open to. Referral placed to allergist for further evaluation/management. ?

## 2021-10-19 NOTE — Addendum Note (Signed)
Addended by: Noemi Chapel on: 10/19/2021 03:10 PM ? ? Modules accepted: Orders ? ?

## 2021-11-01 ENCOUNTER — Encounter: Payer: Self-pay | Admitting: Nurse Practitioner

## 2021-11-01 ENCOUNTER — Ambulatory Visit (INDEPENDENT_AMBULATORY_CARE_PROVIDER_SITE_OTHER): Payer: BC Managed Care – PPO

## 2021-11-01 ENCOUNTER — Ambulatory Visit (INDEPENDENT_AMBULATORY_CARE_PROVIDER_SITE_OTHER): Payer: BC Managed Care – PPO | Admitting: Nurse Practitioner

## 2021-11-01 VITALS — BP 120/68 | HR 90 | Temp 98.5°F | Ht 63.0 in | Wt 202.8 lb

## 2021-11-01 DIAGNOSIS — F17200 Nicotine dependence, unspecified, uncomplicated: Secondary | ICD-10-CM

## 2021-11-01 DIAGNOSIS — R0602 Shortness of breath: Secondary | ICD-10-CM | POA: Diagnosis not present

## 2021-11-01 DIAGNOSIS — J4540 Moderate persistent asthma, uncomplicated: Secondary | ICD-10-CM

## 2021-11-01 DIAGNOSIS — R059 Cough, unspecified: Secondary | ICD-10-CM | POA: Diagnosis not present

## 2021-11-01 DIAGNOSIS — D824 Hyperimmunoglobulin E [IgE] syndrome: Secondary | ICD-10-CM | POA: Insufficient documentation

## 2021-11-01 DIAGNOSIS — J4541 Moderate persistent asthma with (acute) exacerbation: Secondary | ICD-10-CM

## 2021-11-01 LAB — POCT EXHALED NITRIC OXIDE: FeNO level (ppb): 17

## 2021-11-01 MED ORDER — PROMETHAZINE-CODEINE 6.25-10 MG/5ML PO SYRP: 5.0000 mL | ORAL_SOLUTION | Freq: Three times a day (TID) | ORAL | 0 refills | Status: DC | PRN

## 2021-11-01 NOTE — Assessment & Plan Note (Addendum)
Persistent bronchitic type cough. Concerning for other underlying pulm etiology given lab findings. FeNO nl today so will hold off on further prednisone, especially with uncontrolled sugars. Continue triple therapy regimen. Continue singulair and otc allergy medicine for trigger prevention. Cough control measures. CXR today. ?

## 2021-11-01 NOTE — Assessment & Plan Note (Signed)
Quit 5 days ago. Counseled to remain smoke free.  ?

## 2021-11-01 NOTE — Progress Notes (Signed)
Please notify pt CXR clear. Thanks.

## 2021-11-01 NOTE — Progress Notes (Signed)
? ?@Patient  ID: , female    DOB: 1966/04/11, 56 y.o.   MRN: [id number] ? ?Chief Complaint  ?Patient presents with  ? Follow-up  ?  Pt states that her breathing seems to be getting worse right now. She is taking her Symbicort daily and albuterol as needed. Pt states she picked up smoking again for 5 days and then quit and hasn't smoked in 6 days.   ? ? ?Referring provider: ?[id number], MD ? ?HPI: ?56 year old female, active smoker (34 pack years) followed for asthmatic bronchitis and chronic rhinitis.  She is a patient Dr. 59 and was last seen in office on 10/18/2021.  Past medical history significant for obesity, uncontrolled DM, HLD.  Followed by Dr. 10/20/2021 with ENT. ? ?TEST/EVENTS:  ?05/04/2019: Eosinophils 700, IgE 347 ?10/18/2021: Eosinophils 1000, IgE 2519 ? ?07/20/2021: OV with Dr. 07/22/2021.  Treated for exacerbation of asthmatic bronchitis with Depo 120 injection.  Started on Pepcid and Protonix for GERD control.  Started on Singulair at bedtime for trigger prevention.  Concern for possible upper airway component to cough as well.  Continued on Symbicort 80 twice a day.  Patient had irritation and hoarseness with Symbicort 160.  Advised to quit smoking. ? ?10/18/2021: OV with Jayten Gabbard NP.  Reported that she started having a bronchitic type cough again around [place] which had persisted.  She also picked back up smoking and feels like this contributed to her symptoms.  She did quit 5 days before this visit.  Increase shortness of breath with exertion.  Cough was productive with yellow sputum.  Felt like allergy symptoms are relatively well controlled.  On Symbicort 80, unable to tolerate 160 in the past.  Also never started Singulair after last visit. FeNO was borderline elevated.  Treated with prednisone burst and Z-Pak.  Added Spiriva to her regimen given significant smoking history possible COPD component.  Check CBC with differential and IgE, both of which were significantly elevated.  Referral was placed  to allergy for further evaluation/management.  Cough control measures advised.  Close follow-up. ? ?11/01/2021: Today-follow-up ?Patient presents today for follow-up.  She continues to have a persistent bronchitic type cough; however, sputum has cleared up some and cough is mostly dry.  She does have shortness of breath with coughing spells; otherwise feels like this has improved some.  Wheezing has also improved. She did pick back up smoking again, due to some stressful life events and finding out her father has cancer, but she has quit now for 5 days. This always seems to exacerbate her symptoms. She denies fevers, night sweats, anorexia, weight loss, lower extremity swelling. Feels like her allergy symptoms have flare back up some with nasal congestion and clear drainage. Last time, we checked a CBC with differential and IgE.  Eosinophils and IgE were significantly elevated.  Referral was placed to allergy for further evaluation. She started on singulair at bedtime. She continues on spiriva and symbicort 80. She is also using flonase nasal spray. She does wear a CPAP nightly and is worried she doesn't clean it enough as she should. ? ?Allergies  ?Allergen Reactions  ? Codeine Nausea And Vomiting  ? Sulfa Antibiotics Nausea And Vomiting  ? Penicillins Rash  ? ? ?Immunization History  ?Administered Date(s) Administered  ? PFIZER(Purple Top)SARS-COV-2 Vaccination 01/27/2020, 02/18/2020  ? ? ?Past Medical History:  ?Diagnosis Date  ? Allergic rhinitis   ? Asthma   ? Depression   ? Diabetes mellitus without complication (HCC)   ? OSA  on CPAP   ? ? ?Tobacco History: ?Social History  ? ?Tobacco Use  ?Smoking Status Former  ? Packs/day: 1.00  ? Years: 34.00  ? Pack years: 34.00  ? Types: Cigarettes  ? Quit date: 07/08/2021  ? Years since quitting: 0.3  ?Smokeless Tobacco Never  ?Tobacco Comments  ? Pt states she smoked for 5 days and hasn't smoked in 6 days now. 11/01/2021  ? ?Counseling given: Not Answered ?Tobacco comments:  Pt states she smoked for 5 days and hasn't smoked in 6 days now. 11/01/2021 ? ? ?Outpatient Medications Prior to Visit  ?Medication Sig Dispense Refill  ? albuterol (ACCUNEB) 1.25 MG/3ML nebulizer solution Take 1 ampule by nebulization every 4 (four) hours as needed for wheezing.    ? albuterol (VENTOLIN HFA) 108 (90 Base) MCG/ACT inhaler INHALE 1 TO 2 PUFFS INTO LUNGS EVERY 4 HOURS AS NEEDED    ? benzonatate (TESSALON) 100 MG capsule Take 2 capsules (200 mg total) by mouth 3 (three) times daily as needed for cough. 30 capsule 1  ? famotidine (PEPCID) 20 MG tablet One after supper 30 tablet 11  ? fluticasone (FLONASE) 50 MCG/ACT nasal spray Place 2 sprays into both nostrils daily. 16 g 2  ? HYDROcodone-acetaminophen (NORCO) 5-325 MG tablet Take 1 tablet by mouth every 4 (four) hours as needed for moderate pain (severe cough). 15 tablet 0  ? ibuprofen (ADVIL,MOTRIN) 800 MG tablet Take 1 tablet (800 mg total) by mouth 3 (three) times daily. 21 tablet 0  ? JARDIANCE 25 MG TABS tablet Take 25 mg by mouth daily.    ? montelukast (SINGULAIR) 10 MG tablet One at bedtime every night 30 tablet 2  ? pantoprazole (PROTONIX) 40 MG tablet Take 1 tablet (40 mg total) by mouth daily. Take 30-60 min before first meal of the day 30 tablet 2  ? SYMBICORT 80-4.5 MCG/ACT inhaler Take 2 puffs first thing in am and then another 2 puffs about 12 hours later. 11 g 5  ? Tiotropium Bromide Monohydrate (SPIRIVA RESPIMAT) 1.25 MCG/ACT AERS Inhale 2 puffs into the lungs daily. 4 g 5  ? TRULICITY 0.75 MG/0.5ML SOPN Inject 0.75 mg into the skin once a week.    ? azithromycin (ZITHROMAX) 250 MG tablet Take 2 tabs on day one then 1 tab daily for four additional days. 6 tablet 0  ? metFORMIN (GLUCOPHAGE) 1000 MG tablet metformin 1,000 mg tablet ? TAKE 1 TABLET BY MOUTH TWICE DAILY (WITH MORNING AND EVENING MEAL) (Patient not taking: Reported on 10/18/2021)    ? ?No facility-administered medications prior to visit.  ? ? ? ?Review of Systems:   ? ?Constitutional: No weight loss or gain, night sweats, fevers, chills, fatigue, or lassitude. ?HEENT: No headaches, difficulty swallowing, tooth/dental problems, or sore throat. No sneezing, itching, ear ache +nasal congestion/drainage ?CV:  No chest pain, orthopnea, PND, swelling in lower extremities, anasarca, dizziness, palpitations, syncope ?Resp: +dry, bronchitic cough; rare wheeze. No shortness of breath with exertion or at rest. No excess mucus or change in color of mucus.No hemoptysis. No chest wall deformity ?GI:  No heartburn, indigestion, abdominal pain, nausea, vomiting, diarrhea, change in bowel habits, loss of appetite, bloody stools.  ?Skin: No rash, lesions, ulcerations ?MSK:  No joint pain or swelling.  No decreased range of motion.  No back pain. ?Neuro: No dizziness or lightheadedness.  ?Psych: No depression or anxiety. Mood stable.  ? ? ? ?Physical Exam: ? ?BP 120/68 (BP Location: Right Arm, Patient Position: Sitting, Cuff Size: Normal)  Pulse 90   Temp 98.5 ?F (36.9 ?C) (Oral)   Ht  (1.6 m)   Wt 202 lb 12.8 oz (92 kg)   SpO2 95%   BMI 35.92 kg/m?  ? ?GEN: Pleasant, interactive, well-appearing; obese; in no acute distress. ?HEENT:  Normocephalic and atraumatic. EACs patent bilaterally. TM pearly gray with present light reflex bilaterally. PERRLA. Sclera white. Nasal turbinates erythematous, moist and patent bilaterally. No rhinorrhea present. Oropharynx pink and moist, without exudate or edema. No lesions, ulcerations, or postnasal drip.  ?NECK:  Supple w/ fair ROM. No JVD present. Normal carotid impulses w/o bruits. Thyroid symmetrical with no goiter or nodules palpated. No lymphadenopathy.   ?CV: RRR, no m/r/g, no peripheral edema. Pulses intact, +2 bilaterally. No cyanosis, pallor or clubbing. ?PULMONARY:  Unlabored, regular breathing. Minimal end expiratory wheeze bilaterally A&P. No accessory muscle use. No dullness to percussion. ?GI: BS present and normoactive. Soft,  non-tender to palpation. No organomegaly or masses detected. No CVA tenderness. ?MSK: No erythema, warmth or tenderness. Cap refil <2 sec all extrem. No deformities or joint swelling noted.  ?Neuro: A/Ox3. No focal deficits

## 2021-11-01 NOTE — Patient Instructions (Addendum)
Continue Symbicort 2 puffs Twice daily, brush tongue and rinse mouth afterwards ?Continue Albuterol inhaler 2 puffs or 3 mL neb every 6 hours as needed for shortness of breath or wheezing. Notify if symptoms persist despite rescue inhaler/neb use. ?Continue protonix 40 mg daily  ?Continue famotidine 20 mg daily  ?Continue CPAP nightly, minimum 4-6 hours a night  ?Continue Spiriva 2 puffs daily  ?Continue Benzonatate (tessalon perles) 1 capsule Three times a day as needed for cough ?Continue Delsym 2 tsp Twice daily for cough ? ?-Phenergan with codeine 5 mL every 8 hours as needed for cough. Do not drive while taking. May cause drowsiness.  ? ?Start environmental control measures as below. ?-Use over the counter antihistamines such as Zyrtec (cetirizine), Claritin (loratadine), Allegra (fexofenadine), or Xyzal (levocetirizine) daily as needed. May take twice a day during allergy flares. May switch antihistamines every few months. ?-Continue Singulair (montelukast) 10mg  daily at night. ?-Continue Flonase (fluticasone) nasal spray 1 spray per nostril twice a day as needed for nasal congestion.  ?-Nasal saline spray (i.e., Simply Saline) or nasal saline lavage (i.e., NeilMed) is recommended as needed and prior to medicated nasal sprays. ? ?Labs today ? ?High resolution CT scan - someone will contact you for scheduling ? ?Pulmonary function testing - scheduled today  ? ?Chest x ray today.  ?  ?Follow up after PFTs and HRCT with Dr. or Sherene Sires. If symptoms do not improve or worsen, please contact office for sooner follow up or seek emergency care. ?

## 2021-11-01 NOTE — Assessment & Plan Note (Addendum)
Significantly elevated IgE and eosinophils. Concerning for possible underlying autoimmune inflammatory process vs humidifier lung from CPAP vs eosinophilic pneumonia vs hypersensitivity pneumonitis. Check ANCA and aspergillus IgE panel today. HRCT and PFTs ordered. Awaiting appt with allergist.  ? ?Patient Instructions  ?Continue Symbicort 2 puffs Twice daily, brush tongue and rinse mouth afterwards ?Continue Albuterol inhaler 2 puffs or 3 mL neb every 6 hours as needed for shortness of breath or wheezing. Notify if symptoms persist despite rescue inhaler/neb use. ?Continue protonix 40 mg daily  ?Continue famotidine 20 mg daily  ?Continue CPAP nightly, minimum 4-6 hours a night  ?Continue Spiriva 2 puffs daily  ?Continue Benzonatate (tessalon perles) 1 capsule Three times a day as needed for cough ?Continue Delsym 2 tsp Twice daily for cough ? ?-Phenergan with codeine 5 mL every 8 hours as needed for cough. Do not drive while taking. May cause drowsiness.  ? ?Start environmental control measures as below. ?-Use over the counter antihistamines such as Zyrtec (cetirizine), Claritin (loratadine), Allegra (fexofenadine), or Xyzal (levocetirizine) daily as needed. May take twice a day during allergy flares. May switch antihistamines every few months. ?-Continue Singulair (montelukast) 10mg  daily at night. ?-Continue Flonase (fluticasone) nasal spray 1 spray per nostril twice a day as needed for nasal congestion.  ?-Nasal saline spray (i.e., Simply Saline) or nasal saline lavage (i.e., NeilMed) is recommended as needed and prior to medicated nasal sprays. ? ?Labs today ? ?High resolution CT scan - someone will contact you for scheduling ? ?Pulmonary function testing - scheduled today  ? ?Chest x ray today.  ?  ?Follow up after PFTs and HRCT with Dr. or Sherene Sires. If symptoms do not improve or worsen, please contact office for sooner follow up or seek emergency care. ? ? ?

## 2021-11-02 ENCOUNTER — Ambulatory Visit: Payer: BC Managed Care – PPO | Admitting: Nurse Practitioner

## 2021-11-06 ENCOUNTER — Telehealth: Payer: Self-pay | Admitting: Nurse Practitioner

## 2021-11-06 DIAGNOSIS — J4541 Moderate persistent asthma with (acute) exacerbation: Secondary | ICD-10-CM

## 2021-11-07 MED ORDER — PROMETHAZINE-DM 6.25-15 MG/5ML PO SYRP: 5.0000 mL | ORAL_SOLUTION | Freq: Four times a day (QID) | ORAL | 0 refills | Status: DC | PRN

## 2021-11-07 NOTE — Telephone Encounter (Signed)
LMTCB

## 2021-11-07 NOTE — Telephone Encounter (Signed)
Patient is returning phone call. Patient phone number is [phone]. ?

## 2021-11-07 NOTE — Telephone Encounter (Signed)
Called patient but she did not answer. Left message for her to call back.  ? ?[facility]. They confirmed that they do not have the RX for the promethazine with codeine.  ? ?Florentina Addison, can you re-send the cough syrup?  ?

## 2021-11-08 NOTE — Telephone Encounter (Signed)
Called and spoke with patient. She was calling back in regards to the cough syrup. She is aware that Florentina Addison has sent another cough syrup for her. She stated that she was feeling slightly better already but wanted to have the cough syrup on hand. She verbalized understanding of instructions.  ? ?While on the phone, she mentioned that she was speaking with Johny Drilling about her CT scan. She had some additional questions. Stated that she was talking with Johny Drilling a few days ago.  ? ?Johny Drilling, can you please advise? Thanks!  ?

## 2021-11-08 NOTE — Telephone Encounter (Signed)
Spoke to the patient the order has been faxed to [facility] confirmation received ?

## 2021-11-09 LAB — ASPERGILLUS IGE PANEL
A. Amstel/Glaucu Class Interp: 0
A. Flavus Class Interp: 0
A. Fumigatus Class Interp: 0
A. Nidulans Class Interp: 0
A. Niger Class Interp: 0
A. Versicolor Class Interp: 0
Aspergillus amstel/glaucu IgE*: <0.35 kU/L (ref <0.35)
Aspergillus flavus IgE: <0.35 kU/L (ref <0.35)
Aspergillus fumigatus IgE: <0.1 kU/L (ref <0.35)
Aspergillus nidulans IgE: <0.35 kU/L (ref <0.35)
Aspergillus niger IgE: <0.1 kU/L (ref <0.35)
Aspergillus versicolor IgE: <0.1 kU/L (ref <0.35)

## 2021-11-09 NOTE — Progress Notes (Signed)
Aspergillus panel neg. Awaiting ANCA

## 2021-11-10 LAB — ANCA SCREEN W REFLEX TITER: ANCA SCREEN: NEGATIVE

## 2021-11-12 NOTE — Progress Notes (Signed)
Please notify patient all of her lab work was nl from her last visit. Thanks.

## 2021-11-16 ENCOUNTER — Encounter: Payer: Self-pay | Admitting: Nurse Practitioner

## 2021-11-16 DIAGNOSIS — R918 Other nonspecific abnormal finding of lung field: Secondary | ICD-10-CM | POA: Diagnosis not present

## 2021-11-16 DIAGNOSIS — J4541 Moderate persistent asthma with (acute) exacerbation: Secondary | ICD-10-CM | POA: Diagnosis not present

## 2021-11-19 ENCOUNTER — Telehealth: Payer: Self-pay | Admitting: Nurse Practitioner

## 2021-11-19 DIAGNOSIS — J4541 Moderate persistent asthma with (acute) exacerbation: Secondary | ICD-10-CM

## 2021-11-19 DIAGNOSIS — Z87891 Personal history of nicotine dependence: Secondary | ICD-10-CM

## 2021-11-19 NOTE — Telephone Encounter (Signed)
Called patient and left voicemail for her to call office back. She has not received her CT scan at this time. It was ordered by Florentina Addison on 10/31/2021.

## 2021-11-19 NOTE — Telephone Encounter (Signed)
PT completed Ct scan on Friday at [facility]. Pt has questions regarding it. I explained that since we are not part of [facility] we may not have the imaging. Pt said they should be faxing [place]. Advised we would be on the lookout for it but since offices close over the weekend, we may not have gotten it yet. Please call pt back as she still have questions.

## 2021-11-19 NOTE — Telephone Encounter (Signed)
Routing to [employer] as an FYI to be on the lookout of the results.

## 2021-11-21 MED ORDER — AEROCHAMBER MV MISC: 0 refills | Status: DC

## 2021-11-21 MED ORDER — BUDESONIDE-FORMOTEROL FUMARATE 160-4.5 MCG/ACT IN AERO: 2.0000 | INHALATION_SPRAY | Freq: Two times a day (BID) | RESPIRATORY_TRACT | 6 refills | Status: DC

## 2021-11-21 NOTE — Telephone Encounter (Signed)
Spoke with pt and reviewed HRCT results as dictated by Roxan Diesel. Pt stated understanding. Referral and medication orders placed. Nothing further needed at this time.

## 2021-11-21 NOTE — Telephone Encounter (Signed)
ATC LVMTCB x 1  

## 2021-11-21 NOTE — Telephone Encounter (Signed)
Received HRCT results. She had multiple scattered small nodules, which she will need repeat CT chest for in 6-12 months to ensure they do not grow. I recommend referring her to the lung cancer screening program so they can follow her and ensure she has scans at appropriate intervals given she is an active smoker.   There was no evidence of ILD including hypersensitivity pneumonitis or eosinophilic pneumonia. She does have some mild bronchial wall thickening, which is consistent with chronic bronchitis. I would like to trial her on a higher dose of Symbicort and see if she can tolerate it. I know in the past she's had some hoarseness but we can send a spacer in as well. If she is ok with this plan, please send rx for Symbicort 160 2 puffs Twice daily, brush tongue and rinse mouth afterwards; use with spacer.   She also had several liver lesions, likely cysts, but recommend follow up with her PCP to discuss if further workup is necessary. Thanks.

## 2021-11-30 ENCOUNTER — Other Ambulatory Visit: Payer: Self-pay | Admitting: Internal Medicine

## 2021-12-07 ENCOUNTER — Encounter: Payer: Self-pay | Admitting: Allergy & Immunology

## 2021-12-07 ENCOUNTER — Ambulatory Visit (INDEPENDENT_AMBULATORY_CARE_PROVIDER_SITE_OTHER): Payer: BC Managed Care – PPO | Admitting: Allergy & Immunology

## 2021-12-07 VITALS — BP 138/76 | HR 91 | Temp 98.3°F | Resp 22 | Ht 62.25 in | Wt 202.2 lb

## 2021-12-07 DIAGNOSIS — J3089 Other allergic rhinitis: Secondary | ICD-10-CM

## 2021-12-07 DIAGNOSIS — F17200 Nicotine dependence, unspecified, uncomplicated: Secondary | ICD-10-CM | POA: Diagnosis not present

## 2021-12-07 DIAGNOSIS — J4540 Moderate persistent asthma, uncomplicated: Secondary | ICD-10-CM | POA: Diagnosis not present

## 2021-12-07 DIAGNOSIS — J302 Other seasonal allergic rhinitis: Secondary | ICD-10-CM

## 2021-12-07 NOTE — Patient Instructions (Addendum)
1. 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 - Continue with the plan per Micheline Maze.  - Consider the addition of Dupixent or Harrington Challenger (handouts provided), which are injectables medications to help your inhalers work better.  2. Seasonal and perennial allergic rhinitis - Testing today showed: grasses, weeds, trees, indoor molds, outdoor molds, dust mites, dog, and cockroach. - Copy of test results provided.  - Avoidance measures provided. - Stop taking: Flonase - Continue with: Singulair (montelukast) 10mg  daily - Start taking: Zyrtec (cetirizine) 10mg  tablet once daily and Ryaltris (olopatadine/mometasone) two sprays per nostril 1-2 times daily as needed - You can use an extra dose of the antihistamine, if needed, for breakthrough symptoms.  - Consider nasal saline rinses 1-2 times daily to remove allergens from the nasal cavities as well as help with mucous clearance (this is especially helpful to do before the nasal sprays are given) - Consider allergy shots as a means of long-term control. - Allergy shots "re-train" and "reset" the immune system to ignore environmental allergens and decrease the resulting immune response to those allergens (sneezing, itchy watery eyes, runny nose, nasal congestion, etc).    - Allergy shots improve symptoms in 75-85% of patients.  - We can discuss more at the next appointment if the medications are not working for you. - Allergy shots can help with both the traditional allergy symptoms as well as the asthma.  3. Return in about 6 weeks (around 01/18/2022).    Please inform 09-18-1996 of any Emergency Department visits, hospitalizations, or changes in symptoms. Call 01/20/2022 before going to the ED for breathing or allergy symptoms since we might be able to fit you in for a sick visit. Feel free to contact us anytime with any questions, problems, or concerns.  It was a  pleasure to meet you today!  Websites that have reliable patient information: 1. American Academy of Asthma, Allergy, and Immunology: www.aaaai.org 2. Food Allergy Research and Education (FARE): foodallergy.org 3. Mothers of Asthmatics: http://www.asthmacommunitynetwork.org 4. American College of Allergy, Asthma, and Immunology: www.acaai.org   COVID-19 Vaccine Information can be found at: [place] For questions related to vaccine distribution or appointments, please email [email or web address] or call [phone].   We realize that you might be concerned about having an allergic reaction to the COVID19 vaccines. To help with that concern, WE ARE OFFERING THE COVID19 VACCINES IN OUR OFFICE! Ask the front desk for dates!     "Like" [email or web address] on Facebook and Instagram for our latest updates!      A healthy democracy works best when [phone] participate! Make sure you are registered to vote! If you have moved or changed any of your contact information, you will need to get this updated before voting!  In some cases, you MAY be able to register to vote online: [place]     Airborne Adult Perc - 12/07/21 1458     Time Antigen Placed 1458    Allergen Manufacturer [email or web address]    Location Back    Number of Test 59    Panel 1 Select    1. Control-Buffer 50% Glycerol Negative    2. Control-Histamine 1 mg/ml 2+    3. Albumin saline Negative    4. Bahia 2+    5. 02/06/22 Negative    6. Johnson 2+    7. Kentucky Blue Negative    8. Meadow Fescue 2+    9. Perennial Rye 2+  10. Sweet Vernal 2+    11. Timothy 2+    12. Cocklebur Negative    13. Burweed Marshelder Negative    14. Ragweed, short Negative    15. Ragweed, Giant Negative    16. Plantain,  English Negative    17. Lamb's Quarters 2+    18. Sheep Sorrell 2+    19. Rough Pigweed 2+    20. Marsh Elder, Rough Negative    21.  Mugwort, Common 2+    22. Ash mix 2+    23. Birch mix Negative    24. Beech American Negative    25. Box, Elder Negative    26. Cedar, red Negative    27. Cottonwood, Guinea-BissauEastern Negative    28. Elm mix 2+    29. Hickory Negative    30. Maple mix Negative    31. Oak, Guinea[place]iss[place]astern mix Negative    32. Pecan Pollen Negative    33. Pine mix Negative    34. Sycamore Eastern Negative    35. Walnut, Black Pollen 2+    36. Alternaria alternata Negative    37. Cladosporium Herbarum Negative    38. Aspergillus mix Negative    39. Penicillium mix Negative    40. Bipolaris sorokiniana (Helminthosporium) 2+    41. Drechslera spicifera (Curvularia) 2+    42. Mucor plumbeus Negative    43. Fusarium moniliforme 2+    44. Aureobasidium pullulans (pullulara) 2+    45. Rhizopus oryzae Negative    46. Botrytis cinera Negative    47. Epicoccum nigrum 2+    48. Phoma betae Negative    49. Candida Albicans 2+    50. Trichophyton mentagrophytes 2+    51. Mite, D Farinae  5,000 AU/ml 3+    52. Mite, D Pteronyssinus  5,000 AU/ml 3+    53. Cat Hair 10,000 BAU/ml Negative    54.  Dog Epithelia 2+    55. Mixed Feathers Negative    56. Horse Epithelia Negative    57. Cockroach, German 2+    58. Mouse Negative    59. Tobacco Leaf Negative             Reducing Pollen Exposure  The American Academy of Allergy, Asthma and Immunology suggests the following steps to reduce your exposure to pollen during allergy seasons.    Do not hang sheets or clothing out to dry; pollen may collect on these items. Do not mow lawns or spend time around freshly cut grass; mowing stirs up pollen. Keep windows closed at night.  Keep car windows closed while driving. Minimize morning activities outdoors, a time when pollen counts are usually at their highest. Stay indoors as much as possible when pollen counts or humidity is high and on windy days when pollen tends to remain in the air longer. Use air conditioning when  possible.  Many air conditioners have filters that trap the pollen spores. Use a HEPA room air filter to remove pollen form the indoor air you breathe.  Control of Mold Allergen   Mold and fungi can grow on a variety of surfaces provided certain temperature and moisture conditions exist.  Outdoor molds grow on plants, decaying vegetation and soil.  The major outdoor mold, Alternaria and Cladosporium, are found in very high numbers during hot and dry conditions.  Generally, a late Summer - Fall peak is seen for common outdoor fungal spores.  Rain will temporarily lower outdoor mold spore count, but counts rise rapidly when the rainy  period ends.  The most important indoor molds are Aspergillus and Penicillium.  Dark, humid and poorly ventilated basements are ideal sites for mold growth.  The next most common sites of mold growth are the bathroom and the kitchen.  Outdoor (Seasonal) Mold Control  Use air conditioning and keep windows closed Avoid exposure to decaying vegetation. Avoid leaf raking. Avoid grain handling. Consider wearing a face mask if working in moldy areas.    Indoor (Perennial) Mold Control   Maintain humidity below 50%. Clean washable surfaces with 5% bleach solution. Remove sources e.g. contaminated carpets.    Control of Dust Mite Allergen    Dust mites play a major role in allergic asthma and rhinitis.  They occur in environments with high humidity wherever human skin is found.  Dust mites absorb humidity from the atmosphere (ie, they do not drink) and feed on organic matter (including shed human and animal skin).  Dust mites are a microscopic type of insect that you cannot see with the naked eye.  High levels of dust mites have been detected from mattresses, pillows, carpets, upholstered furniture, bed covers, clothes, soft toys and any woven material.  The principal allergen of the dust mite is found in its feces.  A gram of dust may contain 1,000 mites and 250,000  fecal particles.  Mite antigen is easily measured in the air during house cleaning activities.  Dust mites do not bite and do not cause harm to humans, other than by triggering allergies/asthma.    Ways to decrease your exposure to dust mites in your home:  Encase mattresses, box springs and pillows with a mite-impermeable barrier or cover   Wash sheets, blankets and drapes weekly in hot water (130 F) with detergent and dry them in a dryer on the hot setting.  Have the room cleaned frequently with a vacuum cleaner and a damp dust-mop.  For carpeting or rugs, vacuuming with a vacuum cleaner equipped with a high-efficiency particulate air (HEPA) filter.  The dust mite allergic individual should not be in a room which is being cleaned and should wait 1 hour after cleaning before going into the room. Do not sleep on upholstered furniture (eg, couches).   If possible removing carpeting, upholstered furniture and drapery from the home is ideal.  Horizontal blinds should be eliminated in the rooms where the person spends the most time (bedroom, study, television room).  Washable vinyl, roller-type shades are optimal. Remove all non-washable stuffed toys from the bedroom.  Wash stuffed toys weekly like sheets and blankets above.   Reduce indoor humidity to less than 50%.  Inexpensive humidity monitors can be purchased at most hardware stores.  Do not use a humidifier as can make the problem worse and are not recommended.  Control of Dog or Cat Allergen  Avoidance is the best way to manage a dog or cat allergy. If you have a dog or cat and are allergic to dog or cats, consider removing the dog or cat from the home. If you have a dog or cat but don't want to find it a new home, or if your family wants a pet even though someone in the household is allergic, here are some strategies that may help keep symptoms at bay:  Keep the pet out of your bedroom and restrict it to only a few rooms. Be advised that keeping  the dog or cat in only one room will not limit the allergens to that room. Don't pet, hug or kiss  the dog or cat; if you do, wash your hands with soap and water. High-efficiency particulate air (HEPA) cleaners run continuously in a bedroom or living room can reduce allergen levels over time. Regular use of a high-efficiency vacuum cleaner or a central vacuum can reduce allergen levels. Giving your dog or cat a bath at least once a week can reduce airborne allergen.  Allergy Shots   Allergies are the result of a chain reaction that starts in the immune system. Your immune system controls how your body defends itself. For instance, if you have an allergy to pollen, your immune system identifies pollen as an invader or allergen. Your immune system overreacts by producing antibodies called Immunoglobulin E (IgE). These antibodies travel to cells that release chemicals, causing an allergic reaction.  The concept behind allergy immunotherapy, whether it is received in the form of shots or tablets, is that the immune system can be desensitized to specific allergens that trigger allergy symptoms. Although it requires time and patience, the payback can be long-term relief.  How Do Allergy Shots Work?  Allergy shots work much like a vaccine. Your body responds to injected amounts of a particular allergen given in increasing doses, eventually developing a resistance and tolerance to it. Allergy shots can lead to decreased, minimal or no allergy symptoms.  There generally are two phases: build-up and maintenance. Build-up often ranges from three to six months and involves receiving injections with increasing amounts of the allergens. The shots are typically given once or twice a week, though more rapid build-up schedules are sometimes used.  The maintenance phase begins when the most effective dose is reached. This dose is different for each person, depending on how allergic you are and your response to the  build-up injections. Once the maintenance dose is reached, there are longer periods between injections, typically two to four weeks.  Occasionally doctors give cortisone-type shots that can temporarily reduce allergy symptoms. These types of shots are different and should not be confused with allergy immunotherapy shots.  Who Can Be Treated with Allergy Shots?  Allergy shots may be a good treatment approach for people with allergic rhinitis (hay fever), allergic asthma, conjunctivitis (eye allergy) or stinging insect allergy.   Before deciding to begin allergy shots, you should consider:   The length of allergy season and the severity of your symptoms  Whether medications and/or changes to your environment can control your symptoms  Your desire to avoid long-term medication use  Time: allergy immunotherapy requires a major time commitment  Cost: may vary depending on your insurance coverage  Allergy shots for children age 73 and older are effective and often well tolerated. They might prevent the onset of new allergen sensitivities or the progression to asthma.  Allergy shots are not started on patients who are pregnant but can be continued on patients who become pregnant while receiving them. In some patients with other medical conditions or who take certain common medications, allergy shots may be of risk. It is important to mention other medications you talk to your allergist.   When Will I Feel Better?  Some may experience decreased allergy symptoms during the build-up phase. For others, it may take as long as 12 months on the maintenance dose. If there is no improvement after a year of maintenance, your allergist will discuss other treatment options with you.  If you aren't responding to allergy shots, it may be because there is not enough dose of the allergen in your vaccine or  there are missing allergens that were not identified during your allergy testing. Other reasons could be  that there are high levels of the allergen in your environment or major exposure to non-allergic triggers like tobacco smoke.  What Is the Length of Treatment?  Once the maintenance dose is reached, allergy shots are generally continued for three to five years. The decision to stop should be discussed with your allergist at that time. Some people may experience a permanent reduction of allergy symptoms. Others may relapse and a longer course of allergy shots can be considered.  What Are the Possible Reactions?  The two types of adverse reactions that can occur with allergy shots are local and systemic. Common local reactions include very mild redness and swelling at the injection site, which can happen immediately or several hours after. A systemic reaction, which is less common, affects the entire body or a particular body system. They are usually mild and typically respond quickly to medications. Signs include increased allergy symptoms such as sneezing, a stuffy nose or hives.  Rarely, a serious systemic reaction called anaphylaxis can develop. Symptoms include swelling in the throat, wheezing, a feeling of tightness in the chest, nausea or dizziness. Most serious systemic reactions develop within 30 minutes of allergy shots. This is why it is strongly recommended you wait in your doctor's office for 30 minutes after your injections. Your allergist is trained to watch for reactions, and his or her staff is trained and equipped with the proper medications to identify and treat them.  Who Should Administer Allergy Shots?  The preferred location for receiving shots is your prescribing allergist's office. Injections can sometimes be given at another facility where the physician and staff are trained to recognize and treat reactions, and have received instructions by your prescribing allergist.

## 2021-12-07 NOTE — Progress Notes (Signed)
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 - Continue with the plan per [name].  - Consider the addition of Dupixent or Berna Bue (handouts provided), which are injectables medications to help your inhalers work better.  2. Seasonal and perennial allergic rhinitis - Testing today showed: grasses, weeds, trees, indoor molds, outdoor molds, dust mites, dog, and cockroach. - Copy of test results provided.  - Avoidance measures provided. - Stop taking: Flonase - Continue with: Singulair (montelukast) 40m daily - Start taking: Zyrtec (cetirizine) 186mtablet once daily and Ryaltris (olopatadine/mometasone) two sprays per nostril 1-2 times daily as needed - You can use an extra dose of the antihistamine, if needed, for breakthrough symptoms.  - Consider nasal saline rinses 1-2 times daily to remove allergens from the nasal cavities as well as help with mucous clearance (this is especially helpful to do before the nasal sprays are given) - Consider allergy shots as a means of long-term control. - Allergy shots "re-train" and "reset" the immune system to ignore environmental allergens and decrease the resulting immune response to those allergens (sneezing, itchy watery eyes, runny nose, nasal congestion, etc).    - Allergy shots improve symptoms in 75-85% of patients.  - We can discuss more at the next appointment if the medications are not working for you. - Allergy shots can help with both the traditional allergy symptoms as well as the asthma.  3. Return in about 6 weeks (around  01/18/2022).    Please inform usKoreaf any Emergency Department visits, hospitalizations, or changes in symptoms. Call usKoreaefore going to the ED for breathing or allergy symptoms since we might be able to fit you in for a sick visit. Feel free to contact usKoreanytime with any questions, problems, or concerns.  It was a pleasure to meet you today!  Websites that have reliable patient information: 1. American Academy of Asthma, Allergy, and Immunology: www.aaaai.org 2. Food Allergy Research and Education (FARE): foodallergy.org 3. Mothers of Asthmatics: http://www.asthmacommunitynetwork.org 4. American College of Allergy, Asthma, and Immunology: www.acaai.org   COVID-19 Vaccine Information can be found at: [email or web address] questions related to vaccine distribution or appointments, please email [email or web address] or call [phone]  We realize that you might be concerned about having an allergic reaction to the COVID19 vaccines. To help with that concern, WE ARE OFFERING THE COVID19 VACCINES IN OUR OFFICE! Ask the front desk for dates!     "Like" usKorean Facebook and Instagram for our latest updates!      A healthy democracy works best when ALNew York Life Insurancearticipate! Make sure you are registered to vote! If you have moved or changed any of your contact information, you will need to get this updated before voting!  In some cases, you MAY be able to register to vote online: [email or web address]   Airborne Adult Perc - 12/07/21 1458     Time Antigen Placed 1458    Allergen Manufacturer GrLavella Hammock  Location Back    Number of Test 59    Panel 1 Select    1. Control-Buffer 50% Glycerol Negative    2. Control-Histamine 1 mg/ml 2+  3. Albumin saline Negative    4. [place] 2+    5. [place] Negative    6. Johnson 2+    7. [place] Blue Negative    8. Meadow Fescue 2+    9. Perennial Rye 2+    10.  Sweet Vernal 2+    11. Timothy 2+    12. Cocklebur Negative    13. Burweed Marshelder Negative    14. Ragweed, short Negative    15. Ragweed, Giant Negative    16. Plantain,  English Negative    17. Lamb's Quarters 2+    18. Sheep Sorrell 2+    19. Rough Pigweed 2+    20. Marsh Elder, Rough Negative    21. Mugwort, Common 2+    22. Ash mix 2+    23. Birch mix Negative    24. Beech American Negative    25. Box, Elder Negative    26. Cedar, red Negative    27. Cottonwood, [place] Negative    28. Elm mix 2+    29. Hickory Negative    30. Maple mix Negative    31. Oak, [place] mix Negative    32. Pecan Pollen Negative    33. Pine mix Negative    34. Sycamore Eastern Negative    35. [place], Black Pollen 2+    36. Alternaria alternata Negative    37. Cladosporium Herbarum Negative    38. Aspergillus mix Negative    39. Penicillium mix Negative    40. Bipolaris sorokiniana (Helminthosporium) 2+    41. Drechslera spicifera (Curvularia) 2+    42. Mucor plumbeus Negative    43. Fusarium moniliforme 2+    44. Aureobasidium pullulans (pullulara) 2+    45. Rhizopus oryzae Negative    46. Botrytis cinera Negative    47. Epicoccum nigrum 2+    48. Phoma betae Negative    49. Candida Albicans 2+    50. Trichophyton mentagrophytes 2+    51. Mite, D Farinae  5,000 AU/ml 3+    52. Mite, D Pteronyssinus  5,000 AU/ml 3+    53. Cat Hair 10,000 BAU/ml Negative    54.  Dog Epithelia 2+    55. Mixed Feathers Negative    56. Horse Epithelia Negative    57. Cockroach, German 2+    58. Mouse Negative    59. Tobacco Leaf Negative             Reducing Pollen Exposure  The American Academy of Allergy, Asthma and Immunology suggests the following steps to reduce your exposure to pollen during allergy seasons.    Do not hang sheets or clothing out to dry; pollen may collect on these items. Do not mow lawns or spend time around freshly cut grass; mowing stirs up pollen. Keep windows  closed at night.  Keep car windows closed while driving. Minimize morning activities outdoors, a time when pollen counts are usually at their highest. Stay indoors as much as possible when pollen counts or humidity is high and on windy days when pollen tends to remain in the air longer. Use air conditioning when possible.  Many air conditioners have filters that trap the pollen spores. Use a HEPA room air filter to remove pollen form the indoor air you breathe.  Control of Mold Allergen   Mold and fungi can grow on a variety of surfaces provided certain temperature and moisture conditions exist.  Outdoor molds grow on plants, decaying vegetation and soil.  The major outdoor  mold, Alternaria and Cladosporium, are found in very high numbers during hot and dry conditions.  Generally, a late Summer - Fall peak is seen for common outdoor fungal spores.  Rain will temporarily lower outdoor mold spore count, but counts rise rapidly when the rainy period ends.  The most important indoor molds are Aspergillus and Penicillium.  Dark, humid and poorly ventilated basements are ideal sites for mold growth.  The next most common sites of mold growth are the bathroom and the kitchen.  Outdoor (Seasonal) Mold Control  Use air conditioning and keep windows closed Avoid exposure to decaying vegetation. Avoid leaf raking. Avoid grain handling. Consider wearing a face mask if working in moldy areas.    Indoor (Perennial) Mold Control   Maintain humidity below 50%. Clean washable surfaces with 5% bleach solution. Remove sources e.g. contaminated carpets.    Control of Dust Mite Allergen    Dust mites play a major role in allergic asthma and rhinitis.  They occur in environments with high humidity wherever human skin is found.  Dust mites absorb humidity from the atmosphere (ie, they do not drink) and feed on organic matter (including shed human and animal skin).  Dust mites are a microscopic type of insect  that you cannot see with the naked eye.  High levels of dust mites have been detected from mattresses, pillows, carpets, upholstered furniture, bed covers, clothes, soft toys and any woven material.  The principal allergen of the dust mite is found in its feces.  A gram of dust may contain 1,000 mites and 250,000 fecal particles.  Mite antigen is easily measured in the air during house cleaning activities.  Dust mites do not bite and do not cause harm to humans, other than by triggering allergies/asthma.    Ways to decrease your exposure to dust mites in your home:  Encase mattresses, box springs and pillows with a mite-impermeable barrier or cover   Wash sheets, blankets and drapes weekly in hot water (130 F) with detergent and dry them in a dryer on the hot setting.  Have the room cleaned frequently with a vacuum cleaner and a damp dust-mop.  For carpeting or rugs, vacuuming with a vacuum cleaner equipped with a high-efficiency particulate air (HEPA) filter.  The dust mite allergic individual should not be in a room which is being cleaned and should wait 1 hour after cleaning before going into the room. Do not sleep on upholstered furniture (eg, couches).   If possible removing carpeting, upholstered furniture and drapery from the home is ideal.  Horizontal blinds should be eliminated in the rooms where the person spends the most time (bedroom, study, television room).  Washable vinyl, roller-type shades are optimal. Remove all non-washable stuffed toys from the bedroom.  Wash stuffed toys weekly like sheets and blankets above.   Reduce indoor humidity to less than 50%.  Inexpensive humidity monitors can be purchased at most hardware stores.  Do not use a humidifier as can make the problem worse and are not recommended.  Control of Dog or Cat Allergen  Avoidance is the best way to manage a dog or cat allergy. If you have a dog or cat and are allergic to dog or cats, consider removing the dog or cat  from the home. If you have a dog or cat but don't want to find it a new home, or if your family wants a pet even though someone in the household is allergic, here are some strategies that may  help keep symptoms at bay:  Keep the pet out of your bedroom and restrict it to only a few rooms. Be advised that keeping the dog or cat in only one room will not limit the allergens to that room. Don't pet, hug or kiss the dog or cat; if you do, wash your hands with soap and water. High-efficiency particulate air (HEPA) cleaners run continuously in a bedroom or living room can reduce allergen levels over time. Regular use of a high-efficiency vacuum cleaner or a central vacuum can reduce allergen levels. Giving your dog or cat a bath at least once a week can reduce airborne allergen.  Allergy Shots   Allergies are the result of a chain reaction that starts in the immune system. Your immune system controls how your body defends itself. For instance, if you have an allergy to pollen, your immune system identifies pollen as an invader or allergen. Your immune system overreacts by producing antibodies called Immunoglobulin E (IgE). These antibodies travel to cells that release chemicals, causing an allergic reaction.  The concept behind allergy immunotherapy, whether it is received in the form of shots or tablets, is that the immune system can be desensitized to specific allergens that trigger allergy symptoms. Although it requires time and patience, the payback can be long-term relief.  How Do Allergy Shots Work?  Allergy shots work much like a vaccine. Your body responds to injected amounts of a particular allergen given in increasing doses, eventually developing a resistance and tolerance to it. Allergy shots can lead to decreased, minimal or no allergy symptoms.  There generally are two phases: build-up and maintenance. Build-up often ranges from three to six months and involves receiving injections with  increasing amounts of the allergens. The shots are typically given once or twice a week, though more rapid build-up schedules are sometimes used.  The maintenance phase begins when the most effective dose is reached. This dose is different for each person, depending on how allergic you are and your response to the build-up injections. Once the maintenance dose is reached, there are longer periods between injections, typically two to four weeks.  Occasionally doctors give cortisone-type shots that can temporarily reduce allergy symptoms. These types of shots are different and should not be confused with allergy immunotherapy shots.  Who Can Be Treated with Allergy Shots?  Allergy shots may be a good treatment approach for people with allergic rhinitis (hay fever), allergic asthma, conjunctivitis (eye allergy) or stinging insect allergy.   Before deciding to begin allergy shots, you should consider:  . The length of allergy season and the severity of your symptoms . Whether medications and/or changes to your environment can control your symptoms . Your desire to avoid long-term medication use . Time: allergy immunotherapy requires a major time commitment . Cost: may vary depending on your insurance coverage  Allergy shots for children age 63 and older are effective and often well tolerated. They might prevent the onset of new allergen sensitivities or the progression to asthma.  Allergy shots are not started on patients who are pregnant but can be continued on patients who become pregnant while receiving them. In some patients with other medical conditions or who take certain common medications, allergy shots may be of risk. It is important to mention other medications you talk to your allergist.   When Will I Feel Better?  Some may experience decreased allergy symptoms during the build-up phase. For others, it may take as long as 12 months on  the maintenance dose. If there is no improvement  after a year of maintenance, your allergist will discuss other treatment options with you.  If you aren't responding to allergy shots, it may be because there is not enough dose of the allergen in your vaccine or there are missing allergens that were not identified during your allergy testing. Other reasons could be that there are high levels of the allergen in your environment or major exposure to non-allergic triggers like tobacco smoke.  What Is the Length of Treatment?  Once the maintenance dose is reached, allergy shots are generally continued for three to five years. The decision to stop should be discussed with your allergist at that time. Some people may experience a permanent reduction of allergy symptoms. Others may relapse and a longer course of allergy shots can be considered.  What Are the Possible Reactions?  The two types of adverse reactions that can occur with allergy shots are local and systemic. Common local reactions include very mild redness and swelling at the injection site, which can happen immediately or several hours after. A systemic reaction, which is less common, affects the entire body or a particular body system. They are usually mild and typically respond quickly to medications. Signs include increased allergy symptoms such as sneezing, a stuffy nose or hives.  Rarely, a serious systemic reaction called anaphylaxis can develop. Symptoms include swelling in the throat, wheezing, a feeling of tightness in the chest, nausea or dizziness. Most serious systemic reactions develop within 30 minutes of allergy shots. This is why it is strongly recommended you wait in your doctor's office for 30 minutes after your injections. Your allergist is trained to watch for reactions, and his or her staff is trained and equipped with the proper medications to identify and treat them.  Who Should Administer Allergy Shots?  The preferred location for receiving shots is your prescribing  allergist's office. Injections can sometimes be given at another facility where the physician and staff are trained to recognize and treat reactions, and have received instructions by your prescribing allergist.             {Blank single:19197::"This note in its entirety was forwarded to the Provider who requested this consultation."}  Subjective:   Kristin Austin is a 56 y.o. female presenting today for evaluation of  Chief Complaint  Patient presents with  . Breathing Problem    This past week has been having shortness of breath, coughing w some phlegm.   . Allergic Rhinitis     Stays congested all the time and started Flonase.     Yaremi D Dondero has a history of the following: Patient Active Problem List   Diagnosis Date Noted  . 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 Uncontrolled type 2 diabetes mellitus with hyperglycemia ([place]) 09/21/2019  . Mixed hyperlipidemia 09/21/2019  . Class 2 severe obesity due to excess calories with serious comorbidity and body mass index (BMI) of 36.0 to 36.9 in adult [facility]) 09/21/2019  . Rhinitis, chronic 08/18/2019  . Current smoker 05/05/2019  . Asthmatic bronchitis without complication 16/04/9603    History obtained from: chart review and {Persons; PED relatives w/patient:19415::"patient"}.  Jaymi D Chinn was referred by Sharilyn Sites, MD.     Kristin Austin is a 56 y.o. female presenting for an evaluation of allergies contributing to her breathing issues .   Asthma/Respiratory Symptom History: She does have a history of asthma.  She is currently followed by Dr. Melvyn Novas as well as Belenda Cruise  Cobb , NP with [facility].  She is currently taking Symbicort 80 mcg 2 puffs twice daily.  She also had Singulair that was added in April 2023.  Labs at that time showed an IgE of 2519 and an absolute eosinophil count of 1000.  She thinks she has been seeing Dr. Work for around 3 years.  She has a history of chronic coughing as well as phlegm production.  Her  symptoms seem to worsen when she started smoking again.  She goes upwards of 60 days without smoking but then picks it up.  It is unclear how often she needs prednisone, but she did get it back in April 2023.  She does report some irritation with the higher dose Symbicort, but she has been fine with the lower dose Symbicort.  She has been hoarse occasionally with some drainage.  She does have a history of obstructive sleep apnea and has a CPAP machine to use.  She thinks that she needs a new one.  Her PCP, Dr. Hilma Favors, manages this. {Blank single:19197::"Allergic Rhinitis Symptom History: ***"," "}  {Blank single:19197::"Food Allergy Symptom History: ***"," "}  {Blank single:19197::"Skin Symptom History: ***"," "}  {Blank single:19197::"GERD Symptom History: ***"," "}  ***Otherwise, there is no history of other atopic diseases, including {Blank multiple:19196:o:"asthma","food allergies","drug allergies","environmental allergies","stinging insect allergies","eczema","urticaria","contact dermatitis"}. There is no significant infectious history. ***Vaccinations are up to date.    Past Medical History: Patient Active Problem List   Diagnosis Date Noted  . 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 Uncontrolled type 2 diabetes mellitus with hyperglycemia ([place]) 09/21/2019  . Mixed hyperlipidemia 09/21/2019  . Class 2 severe obesity due to excess calories with serious comorbidity and body mass index (BMI) of 36.0 to 36.9 in adult [facility]) 09/21/2019  . Rhinitis, chronic 08/18/2019  . Current smoker 05/05/2019  . Asthmatic bronchitis without complication 58/03/9832    Medication List:  Allergies as of 12/07/2021       Reactions   Codeine Nausea And Vomiting   Other Other (See Comments)   Sulfa Antibiotics Nausea And Vomiting   Penicillins Rash        Medication List        Accurate as of December 07, 2021  4:42 PM. If you have any questions, ask your nurse or doctor.          AeroChamber MV  inhaler Use as instructed   albuterol 1.25 MG/3ML nebulizer solution Commonly known as: ACCUNEB Take 1 ampule by nebulization every 4 (four) hours as needed for wheezing.   albuterol 108 (90 Base) MCG/ACT inhaler Commonly known as: VENTOLIN HFA INHALE 1 TO 2 PUFFS INTO LUNGS EVERY 4 HOURS AS NEEDED   benzonatate 100 MG capsule Commonly known as: TESSALON Take 2 capsules (200 mg total) by mouth 3 (three) times daily as needed for cough.   famotidine 20 MG tablet Commonly known as: Pepcid One after supper   fluticasone 50 MCG/ACT nasal spray Commonly known as: FLONASE Place 2 sprays into both nostrils daily.   HYDROcodone-acetaminophen 5-325 MG tablet Commonly known as: Norco Take 1 tablet by mouth every 4 (four) hours as needed for moderate pain (severe cough).   ibuprofen 800 MG tablet Commonly known as: ADVIL Take 1 tablet (800 mg total) by mouth 3 (three) times daily. What changed:  when to take this reasons to take this   Jardiance 25 MG Tabs tablet Generic drug: empagliflozin Take 25 mg by mouth daily.   montelukast 10 MG tablet Commonly known as: Singulair  One at bedtime every night   nystatin-triamcinolone cream Commonly known as: MYCOLOG II nystatin-triamcinolone 100,000 unit/g-0.1 % topical cream  APPLY TO THE AFFECTED AREA BY TOPICAL ROUTE 2 TIMES PER DAY IN THE MORNING AND EVENING   pantoprazole 40 MG tablet Commonly known as: PROTONIX TAKE 1 TABLET BY MOUTH ONCE DAILY 30 TO 60  MINUTES BEFORE FIRST MEAL OF THE DAY   promethazine-dextromethorphan 6.25-15 MG/5ML syrup Commonly known as: PROMETHAZINE-DM Take 5 mLs by mouth 4 (four) times daily as needed for cough. May cause drowsiness. Use with caution.   Spiriva Respimat 1.25 MCG/ACT Aers Generic drug: Tiotropium Bromide Monohydrate Inhale 2 puffs into the lungs daily.   Symbicort 80-4.5 MCG/ACT inhaler Generic drug: budesonide-formoterol Take 2 puffs first thing in am and then another 2 puffs  about 12 hours later.   budesonide-formoterol 160-4.5 MCG/ACT inhaler Commonly known as: SYMBICORT Inhale 2 puffs into the lungs 2 (two) times daily.   triamcinolone 0.025 % ointment Commonly known as: KENALOG triamcinolone acetonide 0.025 % topical ointment  APPLY OINTMENT TOPICALLY TO AFFECTED AREA TWICE DAILY AS NEEDED FOR ITCHING   Trulicity 1.57 WI/2.0BT Sopn Generic drug: Dulaglutide Inject 0.75 mg into the skin once a week.        Birth History: {Blank single:19197::"non-contributory","born premature and spent time in the NICU","born at term without complications"}  Developmental History: Micheal has met all milestones on time. She has required no {Blank multiple:19196:a:"speech therapy","occupational therapy","physical therapy"}. ***non-contributory  Past Surgical History: Past Surgical History:  Procedure Laterality Date  . COLONOSCOPY       Family History: Family History  Problem Relation Age of Onset  . Cancer Mother   . Diabetes Mellitus II Mother   . Hypertension Mother   . Prostate cancer Father   . Diabetes Mellitus II Father   . Hypertension Sister   . Allergic rhinitis Neg Hx   . Asthma Neg Hx   . Eczema Neg Hx   . Urticaria Neg Hx      Social History: Ivyana lives at home with ***.    ROS     Objective:   Blood pressure 138/76, pulse 91, temperature 98.3 F (36.8 C), resp. rate (!) 22, height 5' 2.25" (1.581 m), weight 202 lb 4 oz (91.7 kg), SpO2 91 %. Body mass index is 36.7 kg/m.     Physical Exam   Diagnostic studies: {Blank single:19197::"none","deferred due to recent antihistamine use","labs sent instead"," "}  Spirometry: {Blank single:19197::"results normal (FEV1: ***%, FVC: ***%, FEV1/FVC: ***%)","results abnormal (FEV1: ***%, FVC: ***%, FEV1/FVC: ***%)"}.    {Blank single:19197::"Spirometry consistent with mild obstructive disease","Spirometry consistent with moderate obstructive disease","Spirometry consistent with severe  obstructive disease","Spirometry consistent with possible restrictive disease","Spirometry consistent with mixed obstructive and restrictive disease","Spirometry uninterpretable due to technique","Spirometry consistent with normal pattern"}. {Blank single:19197::"Albuterol/Atrovent nebulizer","Xopenex/Atrovent nebulizer","Albuterol nebulizer","Albuterol four puffs via MDI","Xopenex four puffs via MDI"} treatment given in clinic with {Blank single:19197::"significant improvement in FEV1 per ATS criteria","significant improvement in FVC per ATS criteria","significant improvement in FEV1 and FVC per ATS criteria","improvement in FEV1, but not significant per ATS criteria","improvement in FVC, but not significant per ATS criteria","improvement in FEV1 and FVC, but not significant per ATS criteria","no improvement"}.  Allergy Studies: {Blank single:19197::"none","labs sent instead"," "}   Airborne Adult Perc - 12/07/21 1458     Time Antigen Placed 1458    Allergen Manufacturer Lavella Hammock    Location Back    Number of Test 59    Panel 1 Select    1. Control-Buffer 50% Glycerol Negative  2. Control-Histamine 1 mg/ml 2+    3. Albumin saline Negative    4. [place] 2+    5. [place] Negative    6. Johnson 2+    7. Clintwood Blue Negative    8. Meadow Fescue 2+    9. Perennial Rye 2+    10. Sweet Vernal 2+    11. Timothy 2+    12. Cocklebur Negative    13. Burweed Marshelder Negative    14. Ragweed, short Negative    15. Ragweed, Giant Negative    16. Plantain,  English Negative    17. Lamb's Quarters 2+    18. Sheep Sorrell 2+    19. Rough Pigweed 2+    20. Marsh Elder, Rough Negative    21. Mugwort, Common 2+    22. Ash mix 2+    23. Birch mix Negative    24. Beech American Negative    25. Box, Elder Negative    26. Cedar, red Negative    27. Cottonwood, [place] Negative    28. Elm mix 2+    29. Hickory Negative    30. Maple mix Negative    31. Oak, [place] mix Negative    32. Pecan Pollen  Negative    33. Pine mix Negative    34. Sycamore Eastern Negative    35. Cherry, Black Pollen 2+    36. Alternaria alternata Negative    37. Cladosporium Herbarum Negative    38. Aspergillus mix Negative    39. Penicillium mix Negative    40. Bipolaris sorokiniana (Helminthosporium) 2+    41. Drechslera spicifera (Curvularia) 2+    42. Mucor plumbeus Negative    43. Fusarium moniliforme 2+    44. Aureobasidium pullulans (pullulara) 2+    45. Rhizopus oryzae Negative    46. Botrytis cinera Negative    47. Epicoccum nigrum 2+    48. Phoma betae Negative    49. Candida Albicans 2+    50. Trichophyton mentagrophytes 2+    51. Mite, D Farinae  5,000 AU/ml 3+    52. Mite, D Pteronyssinus  5,000 AU/ml 3+    53. Cat Hair 10,000 BAU/ml Negative    54.  Dog Epithelia 2+    55. Mixed Feathers Negative    56. Horse Epithelia Negative    57. Cockroach, German 2+    58. Mouse Negative    59. Tobacco Leaf Negative             {Blank single:19197::"Allergy testing results were read and interpreted by myself, documented by clinical staff."," "}         Salvatore Marvel, MD All[facility] and [place] [facility]

## 2021-12-09 ENCOUNTER — Encounter: Payer: Self-pay | Admitting: Allergy & Immunology

## 2021-12-12 ENCOUNTER — Other Ambulatory Visit: Payer: Self-pay

## 2021-12-12 ENCOUNTER — Other Ambulatory Visit (HOSPITAL_COMMUNITY): Payer: BC Managed Care – PPO

## 2021-12-12 DIAGNOSIS — J4541 Moderate persistent asthma with (acute) exacerbation: Secondary | ICD-10-CM

## 2021-12-17 ENCOUNTER — Ambulatory Visit: Payer: BC Managed Care – PPO | Admitting: Nurse Practitioner

## 2021-12-27 DIAGNOSIS — E1165 Type 2 diabetes mellitus with hyperglycemia: Secondary | ICD-10-CM | POA: Diagnosis not present

## 2021-12-27 DIAGNOSIS — J440 Chronic obstructive pulmonary disease with acute lower respiratory infection: Secondary | ICD-10-CM | POA: Diagnosis not present

## 2021-12-27 DIAGNOSIS — E7800 Pure hypercholesterolemia, unspecified: Secondary | ICD-10-CM | POA: Diagnosis not present

## 2021-12-27 DIAGNOSIS — G4733 Obstructive sleep apnea (adult) (pediatric): Secondary | ICD-10-CM | POA: Diagnosis not present

## 2022-01-14 ENCOUNTER — Other Ambulatory Visit: Payer: Self-pay | Admitting: Internal Medicine

## 2022-01-18 ENCOUNTER — Ambulatory Visit: Payer: BC Managed Care – PPO | Admitting: Nurse Practitioner

## 2022-01-18 ENCOUNTER — Ambulatory Visit (INDEPENDENT_AMBULATORY_CARE_PROVIDER_SITE_OTHER): Payer: BC Managed Care – PPO | Admitting: Internal Medicine

## 2022-01-18 ENCOUNTER — Encounter: Payer: Self-pay | Admitting: Nurse Practitioner

## 2022-01-18 VITALS — BP 132/80 | HR 85 | Ht 62.4 in | Wt 194.0 lb

## 2022-01-18 DIAGNOSIS — D824 Hyperimmunoglobulin E [IgE] syndrome: Secondary | ICD-10-CM | POA: Diagnosis not present

## 2022-01-18 DIAGNOSIS — G4733 Obstructive sleep apnea (adult) (pediatric): Secondary | ICD-10-CM | POA: Diagnosis not present

## 2022-01-18 DIAGNOSIS — Z720 Tobacco use: Secondary | ICD-10-CM | POA: Diagnosis not present

## 2022-01-18 DIAGNOSIS — J4540 Moderate persistent asthma, uncomplicated: Secondary | ICD-10-CM | POA: Diagnosis not present

## 2022-01-18 DIAGNOSIS — Z9989 Dependence on other enabling machines and devices: Secondary | ICD-10-CM

## 2022-01-18 DIAGNOSIS — F17200 Nicotine dependence, unspecified, uncomplicated: Secondary | ICD-10-CM

## 2022-01-18 DIAGNOSIS — F17210 Nicotine dependence, cigarettes, uncomplicated: Secondary | ICD-10-CM | POA: Diagnosis not present

## 2022-01-18 DIAGNOSIS — J4541 Moderate persistent asthma with (acute) exacerbation: Secondary | ICD-10-CM | POA: Diagnosis not present

## 2022-01-18 DIAGNOSIS — J45909 Unspecified asthma, uncomplicated: Secondary | ICD-10-CM

## 2022-01-18 DIAGNOSIS — R918 Other nonspecific abnormal finding of lung field: Secondary | ICD-10-CM | POA: Insufficient documentation

## 2022-01-18 LAB — PULMONARY FUNCTION TEST
DL/VA % pred: 132 %
DL/VA: 5.7 ml/min/mmHg/L
DLCO cor % pred: 128 %
DLCO cor: 25.05 ml/min/mmHg
DLCO unc % pred: 128 %
DLCO unc: 25.05 ml/min/mmHg
FEF 25-75 Post: 1.72 L/sec
FEF 25-75 Pre: 1.81 L/sec
FEF2575-%Change-Post: -5 %
FEF2575-%Pred-Post: 70 %
FEF2575-%Pred-Pre: 74 %
FEV1-%Change-Post: -1 %
FEV1-%Pred-Post: 77 %
FEV1-%Pred-Pre: 78 %
FEV1-Post: 1.95 L
FEV1-Pre: 1.98 L
FEV1FVC-%Change-Post: -7 %
FEV1FVC-%Pred-Pre: 99 %
FEV6-%Change-Post: 7 %
FEV6-%Pred-Post: 86 %
FEV6-%Pred-Pre: 80 %
FEV6-Post: 2.69 L
FEV6-Pre: 2.52 L
FEV6FVC-%Change-Post: 0 %
FEV6FVC-%Pred-Post: 103 %
FEV6FVC-%Pred-Pre: 102 %
FVC-%Change-Post: 6 %
FVC-%Pred-Post: 83 %
FVC-%Pred-Pre: 78 %
FVC-Post: 2.69 L
FVC-Pre: 2.53 L
Post FEV1/FVC ratio: 72 %
Post FEV6/FVC ratio: 100 %
Pre FEV1/FVC ratio: 78 %
Pre FEV6/FVC Ratio: 99 %
RV % pred: 219 %
RV: 4.01 L
TLC % pred: 136 %
TLC: 6.57 L

## 2022-01-18 MED ORDER — BUPROPION HCL ER (SR) 150 MG PO TB12: ORAL_TABLET | ORAL | 0 refills | Status: DC

## 2022-01-18 NOTE — Assessment & Plan Note (Signed)
Relatively compensated on current regimen.  She is clinically stable.  PFTs showed a very mild obstruction with FEV1 of 77%, inflated lung volumes and increased diffusion capacity.  Do suspect that a large component of her cough is related to her continued smoking.  Regardless, given her constellation of symptoms and eosinophilia, we reviewed possible biologic therapy, which she is going to consider and notify if she would like to move forward with.  She is going to continue on triple therapy with Symbicort and Spiriva.  Continue as needed albuterol.  Continue Singulair and over-the-counter antihistamine for trigger prevention.  Patient Instructions  Continue Symbicort 2 puffs Twice daily, brush tongue and rinse mouth afterwards Continue spiriva 2 puffs daily Continue Albuterol inhaler 2 puffs or 3 mL neb every 6 hours as needed for shortness of breath or wheezing. Notify if symptoms persist despite rescue inhaler/neb use. Continue protonix 40 mg daily  Continue famotidine 20 mg daily  Continue CPAP nightly, minimum 4-6 hours a night  Continue Benzonatate (tessalon perles) 1 capsule Three times a day as needed for cough Continue Delsym 2 tsp Twice daily for cough Continue singulair 10 mg At bedtime  -Continue over the counter antihistamines such as Zyrtec (cetirizine), Claritin (loratadine), Allegra (fexofenadine), or Xyzal (levocetirizine) daily as needed. May take twice a day during allergy flares. May switch antihistamines every few months. -Continue Flonase (fluticasone) nasal spray 1 spray per nostril twice a day as needed for nasal congestion.   Wellbutrin 150 mg daily for 3 days then increase to 150 mg Twice daily. Continue until you quit smoking and then we will continue for at least another 12 weeks. Monitor your mood and notify of any worsening symptoms.   Read pamphlets on biologic and let me know if you are interested    Follow up in 6 weeks with Dr. Sherene Sires or Katie Kaiulani Sitton,NP. If symptoms  do not improve or worsen, please contact office for sooner follow up or seek emergency care.

## 2022-01-18 NOTE — Assessment & Plan Note (Signed)
Hyper IgE and elevated eosinophilia.  Autoimmune work-up unremarkable and Aspergillus panel was negative.  HRCT did not show any evidence of interstitial lung disease.  She does have some chronic bronchial wall thickening, consistent with chronic bronchitis.  PFTs did not show any evidence of restriction or significant decline in lung function.  She was evaluated by Dr. Dellis Anes and has plans for follow-up in 6 weeks.  She has multiple environmental allergies.  We will continue to monitor her eosinophilia.  See above plan.

## 2022-01-18 NOTE — Assessment & Plan Note (Signed)
Compliant with CPAP therapy and continues to receive good benefit.  No changes made today.

## 2022-01-18 NOTE — Assessment & Plan Note (Signed)
Multiple lung nodules, largest of which 5 mm on HRCT.  Plan for repeat CT 12 months from last scan.  She is an active smoker.  We will discuss referral to lung cancer screening program at her follow-up appointment.

## 2022-01-18 NOTE — Assessment & Plan Note (Signed)
The patient's current tobacco use: 1 ppb The patient was advised to quit and impact of smoking on their health.  I assessed the patient's willingness to attempt to quit. I provided methods and skills for cessation. We reviewed medication management of smoking session drugs if appropriate. Pt opted for Wellbutrin; reviewed potential side effect profile.  Resources to help quit smoking were provided. A smoking cessation quit date was set: February 18, 2022 Follow-up was arranged in our clinic.  The amount of time spent counseling patient was 5 mins

## 2022-01-18 NOTE — Progress Notes (Signed)
@Patient  ID: , female    DOB: May 28, 1966, 56 y.o.   MRN: [id number]  Chief Complaint  Patient presents with   Follow-up    Referring provider: [id number], MD  HPI: 56 year old female, active smoker (34 pack years) followed for asthmatic bronchitis and chronic rhinitis.  She is a patient Dr. 59 and was last seen in office on 11/01/2021 by [facility] NP.  Past medical history significant for obesity, uncontrolled DM, HLD.  Followed by Dr. [facility] with ENT.  TEST/EVENTS:  05/04/2019: Eosinophils 700, IgE 347 10/18/2021: Eosinophils 1000, IgE 2519 11/01/2021: ANCA and Aspergillus panel negative 11/16/2021 HRCT outside facility: Bronchial wall thickening, consistent with chronic bronchitis. no evidence of underlying interstitial lung disease.  Multiple scattered pulmonary nodules, largest of which measures 5 mm.  Hepatic steatosis.  Several low-attenuation liver lesions, largest is 1.7 cm and likely cyst  07/20/2021: OV with Dr. 07/22/2021.  Treated for exacerbation of asthmatic bronchitis with Depo 120 injection.  Started on Pepcid and Protonix for GERD control.  Started on Singulair at bedtime for trigger prevention.  Concern for possible upper airway component to cough as well.  Continued on Symbicort 80 twice a day.  Patient had irritation and hoarseness with Symbicort 160.  Advised to quit smoking.  10/18/2021: OV with Chelsea Pedretti NP.  Reported that she started having a bronchitic type cough again around [place] which had persisted.  She also picked back up smoking and feels like this contributed to her symptoms.  She did quit 5 days before this visit.  Increase shortness of breath with exertion.  Cough was productive with yellow sputum.  Felt like allergy symptoms are relatively well controlled.  On Symbicort 80, unable to tolerate 160 in the past.  Also never started Singulair after last visit. FeNO was borderline elevated.  Treated with prednisone burst and Z-Pak.  Added Spiriva to her regimen given  significant smoking history possible COPD component.  Check CBC with differential and IgE, both of which were significantly elevated.  Referral was placed to allergy for further evaluation/management.  Cough control measures advised.  Close follow-up.  11/01/2021: OV with Lequan Dobratz NP for follow-up.  She continues to have a persistent bronchitic type cough; however, sputum has cleared up some and cough is mostly dry.  She does have shortness of breath with coughing spells; otherwise feels like this has improved some.  Wheezing has also improved. She did pick back up smoking again, due to some stressful life events and finding out her father has cancer, but she has quit now for 5 days. This always seems to exacerbate her symptoms. She denies fevers, night sweats, anorexia, weight loss, lower extremity swelling. Feels like her allergy symptoms have flare back up some with nasal congestion and clear drainage. Last time, we checked a CBC with differential and IgE.  Eosinophils and IgE were significantly elevated.  Referral was placed to allergy for further evaluation. She started on singulair at bedtime. She continues on spiriva and symbicort 80. She is also using flonase nasal spray. FeNO nl and no evidence of bronchospasm so opted against further steroid tx. Counseled to remain smoke free. She does wear a CPAP nightly and is worried she doesn't clean it enough as she should. Further workup for underlying autoimmune inflammatory process vs humidifer lung from CPAP vs eosinophilic pneumonia vs hypersensitivity pneumonitis. ANCA, aspergillus all negative. HRCT and PFTs ordered for further evaluation. Cough control measures continued.  01/18/2022: Today-follow-up Patient presents today for follow-up after undergoing pulmonary function  testing which showed mild obstruction without formal diagnosis of COPD as ratio was 72.  She also had some increased lung volumes and increased diffusion capacity.  No significant  bronchodilator response.  Today, she reports feeling relatively well.  Breathing is overall stable.  She continues to have a chronic cough, which is relatively unchanged compared to when we saw her last.  She has been seen by Dr. Dellis Anes with allergy/asthma who made some adjustments to her allergy regimen.  He also reviewed possible biologic therapy with her given her extensive environmental allergies.  He did not feel that her eosinophil count was high enough to warrant a work-up for Churg-Strauss or Wegener's or other causes of hypereosinophilia. She does continue to smoke 1 pack/day.  She is convinced that this is a large contributing factor to her cough as when she has quit in the past her cough has resolved.  Wants to work on quitting.  Allergies  Allergen Reactions   Codeine Nausea And Vomiting   Other Other (See Comments)   Sulfa Antibiotics Nausea And Vomiting   Penicillins Rash    Immunization History  Administered Date(s) Administered   PFIZER(Purple Top)SARS-COV-2 Vaccination 01/27/2020, 02/18/2020    Past Medical History:  Diagnosis Date   Allergic rhinitis    Asthma    Depression    Diabetes mellitus without complication (HCC)    OSA on CPAP    Psoriasis     Tobacco History: Social History   Tobacco Use  Smoking Status Some Days   Packs/day: 1.00   Years: 34.00   Total pack years: 34.00   Types: Cigarettes  Smokeless Tobacco Never  Tobacco Comments   Pt states she smoked this morning but has been trying to quit. She has been smoking for about a week. 12/07/2021   Ready to quit: Not Answered Counseling given: Not Answered Tobacco comments: Pt states she smoked this morning but has been trying to quit. She has been smoking for about a week. 12/07/2021   Outpatient Medications Prior to Visit  Medication Sig Dispense Refill   albuterol (ACCUNEB) 1.25 MG/3ML nebulizer solution Take 1 ampule by nebulization every 4 (four) hours as needed for wheezing.     albuterol  (VENTOLIN HFA) 108 (90 Base) MCG/ACT inhaler INHALE 1 TO 2 PUFFS INTO LUNGS EVERY 4 HOURS AS NEEDED     benzonatate (TESSALON) 100 MG capsule Take 2 capsules (200 mg total) by mouth 3 (three) times daily as needed for cough. 30 capsule 1   budesonide-formoterol (SYMBICORT) 160-4.5 MCG/ACT inhaler Inhale 2 puffs into the lungs 2 (two) times daily. 1 each 6   famotidine (PEPCID) 20 MG tablet One after supper 30 tablet 11   ibuprofen (ADVIL,MOTRIN) 800 MG tablet Take 1 tablet (800 mg total) by mouth 3 (three) times daily. (Patient taking differently: Take 800 mg by mouth as needed.) 21 tablet 0   insulin degludec (TRESIBA FLEXTOUCH) 100 UNIT/ML FlexTouch Pen 100 Units.     JARDIANCE 25 MG TABS tablet Take 25 mg by mouth daily.     montelukast (SINGULAIR) 10 MG tablet TAKE 1 TABLET BY MOUTH ONCE DAILY AT BEDTIME AT NIGHT 90 tablet 3   nystatin-triamcinolone (MYCOLOG II) cream nystatin-triamcinolone 100,000 unit/g-0.1 % topical cream  APPLY TO THE AFFECTED AREA BY TOPICAL ROUTE 2 TIMES PER DAY IN THE MORNING AND EVENING     pantoprazole (PROTONIX) 40 MG tablet TAKE 1 TABLET BY MOUTH ONCE DAILY 30 TO 60  MINUTES BEFORE FIRST MEAL OF THE DAY 30  tablet 2   Potassium 99 MG TABS Take 1 tablet by mouth every other day.     SYMBICORT 80-4.5 MCG/ACT inhaler Take 2 puffs first thing in am and then another 2 puffs about 12 hours later. 11 g 5   Tiotropium Bromide Monohydrate (SPIRIVA RESPIMAT) 1.25 MCG/ACT AERS Inhale 2 puffs into the lungs daily. 4 g 5   tirzepatide (MOUNJARO) 5 MG/0.5ML Pen 5 mg.     triamcinolone (KENALOG) 0.025 % ointment triamcinolone acetonide 0.025 % topical ointment  APPLY OINTMENT TOPICALLY TO AFFECTED AREA TWICE DAILY AS NEEDED FOR ITCHING     promethazine-dextromethorphan (PROMETHAZINE-DM) 6.25-15 MG/5ML syrup Take 5 mLs by mouth 4 (four) times daily as needed for cough.     fluticasone (FLONASE) 50 MCG/ACT nasal spray Place 2 sprays into both nostrils daily. (Patient not taking:  Reported on 01/18/2022) 16 g 2   TRULICITY 0.75 MG/0.5ML SOPN Inject 0.75 mg into the skin once a week. (Patient not taking: Reported on 01/18/2022)     No facility-administered medications prior to visit.     Review of Systems:   Constitutional: No weight loss or gain, night sweats, fevers, chills, fatigue, or lassitude. HEENT: No headaches, difficulty swallowing, tooth/dental problems, or sore throat. No sneezing, itching, ear ache +nasal congestion/drainage CV:  No chest pain, orthopnea, PND, swelling in lower extremities, anasarca, dizziness, palpitations, syncope Resp: +dry, bronchitic cough; rare wheeze. No shortness of breath with exertion or at rest. No excess mucus or change in color of mucus.No hemoptysis. No chest wall deformity GI:  No heartburn, indigestion, abdominal pain, nausea, vomiting, diarrhea, change in bowel habits, loss of appetite, bloody stools.  Skin: No rash, lesions, ulcerations MSK:  No joint pain or swelling.  No decreased range of motion.  No back pain. Neuro: No dizziness or lightheadedness.  Psych: No depression or anxiety. Mood stable.     Physical Exam:  BP 132/80 (BP Location: Right Arm, Cuff Size: Normal)   Pulse 85   Ht 5' 2.4" (1.585 m)   Wt 194 lb (88 kg)   SpO2 96%   BMI 35.03 kg/m   GEN: Pleasant, interactive, well-appearing; obese; in no acute distress. HEENT:  Normocephalic and atraumatic. EACs patent bilaterally. TM pearly gray with present light reflex bilaterally. PERRLA. Sclera white. Nasal turbinates erythematous, moist and patent bilaterally. No rhinorrhea present. Oropharynx pink and moist, without exudate or edema. No lesions, ulcerations, or postnasal drip.  NECK:  Supple w/ fair ROM. No JVD present. Normal carotid impulses w/o bruits. Thyroid symmetrical with no goiter or nodules palpated. No lymphadenopathy.   CV: RRR, no m/r/g, no peripheral edema. Pulses intact, +2 bilaterally. No cyanosis, pallor or clubbing. PULMONARY:   Unlabored, regular breathing.  Clear bilaterally A&P without wheezes/rales/rhonchi. No accessory muscle use. No dullness to percussion. GI: BS present and normoactive. Soft, non-tender to palpation. No organomegaly or masses detected. No CVA tenderness. MSK: No erythema, warmth or tenderness. Cap refil <2 sec all extrem. No deformities or joint swelling noted.  Neuro: A/Ox3. No focal deficits noted.   Skin: Warm, no lesions or rashe Psych: Normal affect and behavior. Judgement and thought content appropriate.     Lab Results:  CBC    Component Value Date/Time   WBC 10.7 (H) 10/18/2021 1539   RBC 5.39 (H) 10/18/2021 1539   HGB 15.2 (H) 10/18/2021 1539   HCT 45.0 10/18/2021 1539   PLT 283.0 10/18/2021 1539   MCV 83.5 10/18/2021 1539   MCHC 33.7 10/18/2021 1539   RDW  14.2 10/18/2021 1539   LYMPHSABS 2.3 10/18/2021 1539   MONOABS 0.8 10/18/2021 1539   EOSABS 1.0 (H) 10/18/2021 1539   BASOSABS 0.1 10/18/2021 1539    BMET No results found for: "NA", "K", "CL", "CO2", "GLUCOSE", "BUN", "CREATININE", "CALCIUM", "GFRNONAA", "GFRAA"  BNP No results found for: "BNP"   Imaging:  No results found.       Latest Ref Rng & Units 01/18/2022    3:03 PM  PFT Results  FVC-Pre L 2.53  P  FVC-Predicted Pre % 78  P  FVC-Post L 2.69  P  FVC-Predicted Post % 83  P  Pre FEV1/FVC % % 78  P  Post FEV1/FCV % % 72  P  FEV1-Pre L 1.98  P  FEV1-Predicted Pre % 78  P  FEV1-Post L 1.95  P  DLCO uncorrected ml/min/mmHg 25.05  P  DLCO UNC% % 128  P  DLCO corrected ml/min/mmHg 25.05  P  DLCO COR %Predicted % 128  P  DLVA Predicted % 132  P  TLC L 6.57  P  TLC % Predicted % 136  P  RV % Predicted % 219  P    P Preliminary result    No results found for: "NITRICOXIDE"      Assessment & Plan:   Asthmatic bronchitis without complication Relatively compensated on current regimen.  She is clinically stable.  PFTs showed a very mild obstruction with FEV1 of 77%, inflated lung volumes  and increased diffusion capacity.  Do suspect that a large component of her cough is related to her continued smoking.  Regardless, given her constellation of symptoms and eosinophilia, we reviewed possible biologic therapy, which she is going to consider and notify if she would like to move forward with.  She is going to continue on triple therapy with Symbicort and Spiriva.  Continue as needed albuterol.  Continue Singulair and over-the-counter antihistamine for trigger prevention.  Patient Instructions  Continue Symbicort 2 puffs Twice daily, brush tongue and rinse mouth afterwards Continue spiriva 2 puffs daily Continue Albuterol inhaler 2 puffs or 3 mL neb every 6 hours as needed for shortness of breath or wheezing. Notify if symptoms persist despite rescue inhaler/neb use. Continue protonix 40 mg daily  Continue famotidine 20 mg daily  Continue CPAP nightly, minimum 4-6 hours a night  Continue Benzonatate (tessalon perles) 1 capsule Three times a day as needed for cough Continue Delsym 2 tsp Twice daily for cough Continue singulair 10 mg At bedtime  -Continue over the counter antihistamines such as Zyrtec (cetirizine), Claritin (loratadine), Allegra (fexofenadine), or Xyzal (levocetirizine) daily as needed. May take twice a day during allergy flares. May switch antihistamines every few months. -Continue Flonase (fluticasone) nasal spray 1 spray per nostril twice a day as needed for nasal congestion.   Wellbutrin 150 mg daily for 3 days then increase to 150 mg Twice daily. Continue until you quit smoking and then we will continue for at least another 12 weeks. Monitor your mood and notify of any worsening symptoms.   Read pamphlets on biologic and let me know if you are interested    Follow up in 6 weeks with Dr. Sherene SiresWert or Katie Thy Gullikson,NP. If symptoms do not improve or worsen, please contact office for sooner follow up or seek emergency care.    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  Autoimmune work-up unremarkable and Aspergillus panel was negative.  HRCT did not show any evidence of interstitial lung disease.  She does  have some chronic bronchial wall thickening, consistent with chronic bronchitis.  PFTs did not show any evidence of restriction or significant decline in lung function.  She was evaluated by Dr. Dellis Anes and has plans for follow-up in 6 weeks.  She has multiple environmental allergies.  We will continue to monitor her eosinophilia.  See above plan.  Current smoker The patient's current tobacco use: 1 ppb The patient was advised to quit and impact of smoking on their health.  I assessed the patient's willingness to attempt to quit. I provided methods and skills for cessation. We reviewed medication management of smoking session drugs if appropriate. Pt opted for Wellbutrin; reviewed potential side effect profile.  Resources to help quit smoking were provided. A smoking cessation quit date was set: February 18, 2022 Follow-up was arranged in our clinic.  The amount of time spent counseling patient was 5 mins    Lung nodules Multiple lung nodules, largest of which 5 mm on HRCT.  Plan for repeat CT 12 months from last scan.  She is an active smoker.  We will discuss referral to lung cancer screening program at her follow-up appointment.  OSA on CPAP Compliant with CPAP therapy and continues to receive good benefit.  No changes made today.   I spent 35 minutes of dedicated to the care of this patient on the date of this encounter to include pre-visit review of records, face-to-face time with the patient discussing conditions above, post visit ordering of testing, clinical documentation with the electronic health record, making appropriate referrals as documented, and communicating necessary findings to members of the patients care team.  Noemi Chapel, NP 01/18/2022  Pt aware and understands NP's role.

## 2022-01-18 NOTE — Patient Instructions (Addendum)
Continue Symbicort 2 puffs Twice daily, brush tongue and rinse mouth afterwards Continue spiriva 2 puffs daily Continue Albuterol inhaler 2 puffs or 3 mL neb every 6 hours as needed for shortness of breath or wheezing. Notify if symptoms persist despite rescue inhaler/neb use. Continue protonix 40 mg daily  Continue famotidine 20 mg daily  Continue CPAP nightly, minimum 4-6 hours a night  Continue Benzonatate (tessalon perles) 1 capsule Three times a day as needed for cough Continue Delsym 2 tsp Twice daily for cough Continue singulair 10 mg At bedtime  -Continue over the counter antihistamines such as Zyrtec (cetirizine), Claritin (loratadine), Allegra (fexofenadine), or Xyzal (levocetirizine) daily as needed. May take twice a day during allergy flares. May switch antihistamines every few months. -Continue Flonase (fluticasone) nasal spray 1 spray per nostril twice a day as needed for nasal congestion.   Wellbutrin 150 mg daily for 3 days then increase to 150 mg Twice daily. Continue until you quit smoking and then we will continue for at least another 12 weeks. Monitor your mood and notify of any worsening symptoms.   Read pamphlets on biologic and let me know if you are interested    Follow up in 6 weeks with Dr. Sherene Sires or Katie Aanika Defoor,NP. If symptoms do not improve or worsen, please contact office for sooner follow up or seek emergency care.

## 2022-01-18 NOTE — Progress Notes (Signed)
Full PFT performed today. °

## 2022-01-18 NOTE — Patient Instructions (Signed)
Full PFT performed today. °

## 2022-01-24 ENCOUNTER — Ambulatory Visit: Payer: BC Managed Care – PPO | Admitting: Internal Medicine

## 2022-01-24 DIAGNOSIS — E1165 Type 2 diabetes mellitus with hyperglycemia: Secondary | ICD-10-CM | POA: Diagnosis not present

## 2022-01-24 DIAGNOSIS — E669 Obesity, unspecified: Secondary | ICD-10-CM | POA: Diagnosis not present

## 2022-01-24 DIAGNOSIS — E7800 Pure hypercholesterolemia, unspecified: Secondary | ICD-10-CM | POA: Diagnosis not present

## 2022-01-25 ENCOUNTER — Telehealth: Payer: Self-pay

## 2022-01-25 MED ORDER — RYALTRIS 665-25 MCG/ACT NA SUSP: NASAL | 5 refills | Status: DC

## 2022-01-25 NOTE — Telephone Encounter (Signed)
Patient called stating she would like to move fwd with a prescription on the Ryaltris. She wanted it sent to [facility]. I did inform her that I think it has to go to a specialty pharmacy, but a nurse would be in contact with her.

## 2022-01-25 NOTE — Telephone Encounter (Signed)
Called patient and informed her that the Ryaltris goes to the specailty pharmacy [facility] for the special pricing and unsure how much it would cost if we sent in the prescription to her [place] pharmacy. Gave patient the phone number so that she would not miss the call. Prescription has been sent to [facility]

## 2022-01-28 ENCOUNTER — Ambulatory Visit: Payer: BC Managed Care – PPO | Admitting: Family Medicine

## 2022-02-24 ENCOUNTER — Other Ambulatory Visit: Payer: Self-pay | Admitting: Internal Medicine

## 2022-03-08 ENCOUNTER — Ambulatory Visit: Payer: BC Managed Care – PPO | Admitting: Nurse Practitioner

## 2022-04-08 ENCOUNTER — Other Ambulatory Visit: Payer: Self-pay | Admitting: Nurse Practitioner

## 2022-04-19 ENCOUNTER — Encounter: Payer: Self-pay | Admitting: Nurse Practitioner

## 2022-04-19 ENCOUNTER — Ambulatory Visit (INDEPENDENT_AMBULATORY_CARE_PROVIDER_SITE_OTHER): Payer: BC Managed Care – PPO | Admitting: Nurse Practitioner

## 2022-04-19 VITALS — BP 134/70 | HR 82 | Temp 98.1°F | Ht 63.0 in | Wt 188.2 lb

## 2022-04-19 DIAGNOSIS — F17210 Nicotine dependence, cigarettes, uncomplicated: Secondary | ICD-10-CM

## 2022-04-19 DIAGNOSIS — J310 Chronic rhinitis: Secondary | ICD-10-CM | POA: Diagnosis not present

## 2022-04-19 DIAGNOSIS — J4540 Moderate persistent asthma, uncomplicated: Secondary | ICD-10-CM | POA: Diagnosis not present

## 2022-04-19 DIAGNOSIS — F17200 Nicotine dependence, unspecified, uncomplicated: Secondary | ICD-10-CM

## 2022-04-19 DIAGNOSIS — G4733 Obstructive sleep apnea (adult) (pediatric): Secondary | ICD-10-CM | POA: Diagnosis not present

## 2022-04-19 NOTE — Assessment & Plan Note (Signed)
OSA on CPAP. She is managed by Dr. Hilma Favors, her PCP, currently. Hasn't been seen for this in a while. Download today shows poor control with residual AHI 12.1 and significant mask leaks. She is currently on set pressure of 12 cmH2O. Having breakthrough fatigue symptoms. Advised adjusting to auto PAP setting 5-12 cmH2O. Set up for mask fitting to ensure she has proper mask fit. She will let us know if Dr. Hilma Favors would like to continue to manage her OSA. Will need download in 6 weeks to evaluate for residual events/improvement. Cautioned to be aware of and avoid drowsy driving.

## 2022-04-19 NOTE — Patient Instructions (Addendum)
Continue Symbicort 2 puffs Twice daily, brush tongue and rinse mouth afterwards Continue spiriva 2 puffs daily Continue Albuterol inhaler 2 puffs or 3 mL neb every 6 hours as needed for shortness of breath or wheezing. Notify if symptoms persist despite rescue inhaler/neb use. Continue protonix 40 mg daily  Continue famotidine 20 mg daily  Continue Benzonatate (tessalon perles) 1 capsule Three times a day as needed for cough Continue singulair 10 mg At bedtime  -Continue Flonase (fluticasone) nasal spray 1 spray per nostril twice a day as needed for nasal congestion.  Continue allergy medicine daily   Start Wellbutrin 150 mg daily for 3 days then increase to 150 mg Twice daily. Continue until you quit smoking and then we will continue for at least another 12 weeks. Monitor your mood and notify of any worsening symptoms.   Continue CPAP nightly, minimum 4-6 hours a night  Adjust settings to 5-12 cmH2O - order sent to medical supply company Attend mask fitting at [facility] - someone will contact you for scheduling.  Follow up with Dr. Hilma Favors or [place] in 6 weeks to ensure sleep apnea is better controlled on adjusted settings   Follow up in 4-6 months with Dr. Melvyn Novas or Alanson Aly. If symptoms do not improve or worsen, please contact office for sooner follow up or seek emergency care.

## 2022-04-19 NOTE — Progress Notes (Signed)
@Patient  ID: Kristin Austin, female    DOB: [birth year], 56 y.o.   MRN: [id number]  Chief Complaint  Patient presents with   Follow-up    Pt is doing well    Referring provider: Sharilyn Sites, MD  HPI: 56 year old female, active smoker (34 pack years) followed for asthmatic bronchitis and chronic rhinitis.  She is a patient Kristin Austin and was last seen in office on 01/18/2022 by Kristin Austin.  Past medical history significant for obesity, uncontrolled DM, HLD.  Followed by Kristin Austin with ENT.  TEST/EVENTS:  05/04/2019: Eosinophils 700, IgE 347 10/18/2021: Eosinophils 1000, IgE 2519 11/01/2021: ANCA and Aspergillus panel negative 11/16/2021 HRCT outside facility: Bronchial wall thickening, consistent with chronic bronchitis. no evidence of underlying interstitial lung disease.  Multiple scattered pulmonary nodules, largest of which measures 5 mm.  Hepatic steatosis.  Several low-attenuation liver lesions, largest is 1.7 cm and likely cyst  07/20/2021: OV with Dr. Melvyn Austin.  Treated for exacerbation of asthmatic bronchitis with Depo 120 injection.  Started on Pepcid and Protonix for GERD control.  Started on Singulair at bedtime for trigger prevention.  Concern for possible upper airway component to cough as well.  Continued on Symbicort 80 twice a day.  Patient had irritation and hoarseness with Symbicort 160.  Advised to quit smoking.  10/18/2021: OV with Kristin Dastrup Austin.  Reported that she started having a bronchitic type cough again around [place] which had persisted.  She also picked back up smoking and feels like this contributed to her symptoms.  She did quit 5 days before this visit.  Increase shortness of breath with exertion.  Cough was productive with yellow sputum.  Felt like allergy symptoms are relatively well controlled.  On Symbicort 80, unable to tolerate 160 in the past.  Also never started Singulair after last visit. FeNO was borderline elevated.  Treated with prednisone burst and Z-Pak.  Added Spiriva to  her regimen given significant smoking history possible COPD component.  Check CBC with differential and IgE, both of which were significantly elevated.  Referral was placed to allergy for further evaluation/management.  Cough control measures advised.  Close follow-up.  11/01/2021: OV with Kristin Katona Austin for follow-up.  She continues to have a persistent bronchitic type cough; however, sputum has cleared up some and cough is mostly dry.  She does have shortness of breath with coughing spells; otherwise feels like this has improved some.  Wheezing has also improved. She did pick back up smoking again, due to some stressful life events and finding out her father has cancer, but she has quit now for 5 days. This always seems to exacerbate her symptoms. She denies fevers, night sweats, anorexia, weight loss, lower extremity swelling. Feels like her allergy symptoms have flare back up some with nasal congestion and clear drainage. Last time, we checked a CBC with differential and IgE.  Eosinophils and IgE were significantly elevated.  Referral was placed to allergy for further evaluation. She started on singulair at bedtime. She continues on spiriva and symbicort 80. She is also using flonase nasal spray. FeNO nl and no evidence of bronchospasm so opted against further steroid tx. Counseled to remain smoke free. She does wear a CPAP nightly and is worried she doesn't clean it enough as she should. Further workup for underlying autoimmune inflammatory process vs humidifer lung from CPAP vs eosinophilic pneumonia vs hypersensitivity pneumonitis. ANCA, aspergillus all negative. HRCT and PFTs ordered for further evaluation. Cough control measures continued.  01/18/2022: OV with Kristin Thurow  Austin for follow-up after undergoing pulmonary function testing which showed mild obstruction without formal diagnosis of COPD as ratio was 72.  She also had some increased lung volumes and increased diffusion capacity.  No significant bronchodilator  response.  Today, she reports feeling relatively well.  Breathing is overall stable.  She continues to have a chronic cough, which is relatively unchanged compared to when we saw her last.  She has been seen by Kristin Austin with allergy/asthma who made some adjustments to her allergy regimen.  He also reviewed possible biologic therapy with her given her extensive environmental allergies.  He did not feel that her eosinophil count was high enough to warrant a work-up for Churg-Strauss or Wegener's or other causes of hypereosinophilia. She does continue to smoke 1 pack/day.  She is convinced that this is a large contributing factor to her cough as when she has quit in the past her cough has resolved.  Wants to work on quitting - started on Wellbutrin.  04/19/2022: Today - follow up Patient presents today for follow up.  She has been doing well since we saw her last.  No flareups of her asthma.  Still has an occasional, chronic cough which is unchanged from baseline.  Does feel like her adjusted allergy regimen has helped.  She continues on Symbicort and Spiriva.  Denies any increased chest congestion or wheezing.  Does not feel like she has any limitations in her activity level.  She does continue to smoke around a pack a day.  Never started the Wellbutrin from last time.  She tells me that she does not really have any excuse and does want to quit.  She is not sure why she never tried it. She brought her SD card in from her CPAP today. Kristin Austin normally manages this. She wears it every night. Still wakes feeling tired in the morning. She also feels fatigued during the day. She denies any drowsy driving, morning headaches, sleep parasomnias/paralysis. No hx of narcolepsy or cataplexy. She wears a nasal mask. Hasn't changed it out in the last few months.   01/19/2022-04/18/2022 CPAP 12 cmH2O 90/90 days; 100% >4 hr; av use 8 hr 26 min Leaks median 98.2, 95th 141.8 AHI 12.1  Allergies  Allergen Reactions    Codeine Nausea And Vomiting   Other Other (See Comments)   Sulfa Antibiotics Nausea And Vomiting   Penicillins Rash    Immunization History  Administered Date(s) Administered   PFIZER(Purple Top)SARS-COV-2 Vaccination 01/27/2020, 02/18/2020    Past Medical History:  Diagnosis Date   Allergic rhinitis    Asthma    Depression    Diabetes mellitus without complication (HCC)    OSA on CPAP    Psoriasis     Tobacco History: Social History   Tobacco Use  Smoking Status Some Days   Packs/day: 1.00   Years: 34.00   Total pack years: 34.00   Types: Cigarettes  Smokeless Tobacco Never  Tobacco Comments   1 ppd 04/19/2022 PAP   Ready to quit: Not Answered Counseling given: Not Answered Tobacco comments: 1 ppd 04/19/2022 PAP   Outpatient Medications Prior to Visit  Medication Sig Dispense Refill   albuterol (ACCUNEB) 1.25 MG/3ML nebulizer solution Take 1 ampule by nebulization every 4 (four) hours as needed for wheezing.     albuterol (VENTOLIN HFA) 108 (90 Base) MCG/ACT inhaler INHALE 1 TO 2 PUFFS INTO LUNGS EVERY 4 HOURS AS NEEDED     budesonide-formoterol (SYMBICORT) 160-4.5 MCG/ACT inhaler Inhale 2 puffs  into the lungs 2 (two) times daily. 1 each 6   famotidine (PEPCID) 20 MG tablet One after supper 30 tablet 11   fluticasone (FLONASE) 50 MCG/ACT nasal spray Place 2 sprays into both nostrils daily. 16 g 2   ibuprofen (ADVIL,MOTRIN) 800 MG tablet Take 1 tablet (800 mg total) by mouth 3 (three) times daily. (Patient taking differently: Take 800 mg by mouth as needed.) 21 tablet 0   insulin degludec (TRESIBA FLEXTOUCH) 100 UNIT/ML FlexTouch Pen 100 Units.     JARDIANCE 25 MG TABS tablet Take 25 mg by mouth daily.     montelukast (SINGULAIR) 10 MG tablet TAKE 1 TABLET BY MOUTH ONCE DAILY AT BEDTIME AT NIGHT 90 tablet 3   nystatin-triamcinolone (MYCOLOG II) cream nystatin-triamcinolone 100,000 unit/g-0.1 % topical cream  APPLY TO THE AFFECTED AREA BY TOPICAL ROUTE 2 TIMES PER  DAY IN THE MORNING AND EVENING     Olopatadine-Mometasone (RYALTRIS) 665-25 MCG/ACT SUSP 2 sprays per nostril 1-2 times daily as needed 29 g 5   pantoprazole (PROTONIX) 40 MG tablet TAKE 1 TABLET BY MOUTH ONCE DAILY 30  TO  60  MINUTES  BEFORE  FIRST  MEAL  OF  THE  DAY. 30 tablet 2   Potassium 99 MG TABS Take 1 tablet by mouth every other day.     SPIRIVA RESPIMAT 1.25 MCG/ACT AERS INHALE 2 SPRAY(S) BY MOUTH ONCE DAILY 4 g 0   tirzepatide (MOUNJARO) 5 MG/0.5ML Pen 5 mg.     triamcinolone (KENALOG) 0.025 % ointment triamcinolone acetonide 0.025 % topical ointment  APPLY OINTMENT TOPICALLY TO AFFECTED AREA TWICE DAILY AS NEEDED FOR ITCHING     benzonatate (TESSALON) 100 MG capsule Take 2 capsules (200 mg total) by mouth 3 (three) times daily as needed for cough. (Patient not taking: Reported on 04/19/2022) 30 capsule 1   buPROPion (WELLBUTRIN SR) 150 MG 12 hr tablet Take 1 tablet (150 mg total) by mouth daily for 3 days, THEN 1 tablet (150 mg total) 2 (two) times daily. 171 tablet 0   SYMBICORT 80-4.5 MCG/ACT inhaler Take 2 puffs first thing in am and then another 2 puffs about 12 hours later. (Patient not taking: Reported on 04/19/2022) 11 g 5   TRULICITY 0.75 MG/0.5ML SOPN Inject 0.75 mg into the skin once a week. (Patient not taking: Reported on 04/19/2022)     No facility-administered medications prior to visit.     Review of Systems:   Constitutional: No weight loss or gain, night sweats, fevers, chills, or lassitude. +daytime fatigue  HEENT: No headaches, difficulty swallowing, tooth/dental problems, or sore throat. No sneezing, itching, ear ache +nasal congestion/drainage (improved) CV:  No chest pain, orthopnea, PND, swelling in lower extremities, anasarca, dizziness, palpitations, syncope Resp: +dry, chronic cough. No shortness of breath with exertion or at rest. No excess mucus or change in color of mucus.No hemoptysis. No wheeze. No chest wall deformity GI:  No heartburn,  indigestion, abdominal pain, nausea, vomiting, diarrhea, change in bowel habits, loss of appetite, bloody stools.  Skin: No rash, lesions, ulcerations MSK:  No joint pain or swelling.  No decreased range of motion.  No back pain. Neuro: No dizziness or lightheadedness.  Psych: No depression or anxiety. Mood stable.     Physical Exam:  BP 134/70 (BP Location: Right Arm, Patient Position: Sitting, Cuff Size: Normal)   Pulse 82   Temp 98.1 F (36.7 C) (Oral)   Ht 5\' 3"  (1.6 m)   Wt 188 lb 3.2 oz (85.4  kg)   SpO2 97%   BMI 33.34 kg/m   GEN: Pleasant, interactive, well-appearing; obese; in no acute distress. HEENT:  Normocephalic and atraumatic. PERRLA. Sclera white. Nasal turbinates pink, moist and patent bilaterally. No rhinorrhea present. Oropharynx pink and moist, without exudate or edema. No lesions, ulcerations, or postnasal drip. Mallampati II NECK:  Supple w/ fair ROM. No JVD present. Normal carotid impulses w/o bruits. Thyroid symmetrical with no goiter or nodules palpated. No lymphadenopathy.   CV: RRR, no m/r/g, no peripheral edema. Pulses intact, +2 bilaterally. No cyanosis, pallor or clubbing. PULMONARY:  Unlabored, regular breathing.  Clear bilaterally A&P without wheezes/rales/rhonchi. No accessory muscle use. No dullness to percussion. GI: BS present and normoactive. Soft, non-tender to palpation. No organomegaly or masses detected.  MSK: No erythema, warmth or tenderness. Cap refil <2 sec all extrem. No deformities or joint swelling noted.  Neuro: A/Ox3. No focal deficits noted.   Skin: Warm, no lesions or rashe Psych: Normal affect and behavior. Judgement and thought content appropriate.     Lab Results:  CBC    Component Value Date/Time   WBC 10.7 (H) 10/18/2021 1539   RBC 5.39 (H) 10/18/2021 1539   HGB 15.2 (H) 10/18/2021 1539   HCT 45.0 10/18/2021 1539   PLT 283.0 10/18/2021 1539   MCV 83.5 10/18/2021 1539   MCHC 33.7 10/18/2021 1539   RDW 14.2 10/18/2021  1539   LYMPHSABS 2.3 10/18/2021 1539   MONOABS 0.8 10/18/2021 1539   EOSABS 1.0 (H) 10/18/2021 1539   BASOSABS 0.1 10/18/2021 1539    BMET No results found for: "NA", "K", "CL", "CO2", "GLUCOSE", "BUN", "CREATININE", "CALCIUM", "GFRNONAA", "GFRAA"  BNP No results found for: "BNP"   Imaging:  No results found.       Latest Ref Rng & Units 01/18/2022    3:03 PM  PFT Results  FVC-Pre L 2.53   FVC-Predicted Pre % 78   FVC-Post L 2.69   FVC-Predicted Post % 83   Pre FEV1/FVC % % 78   Post FEV1/FCV % % 72   FEV1-Pre L 1.98   FEV1-Predicted Pre % 78   FEV1-Post L 1.95   DLCO uncorrected ml/min/mmHg 25.05   DLCO UNC% % 128   DLCO corrected ml/min/mmHg 25.05   DLCO COR %Predicted % 128   DLVA Predicted % 132   TLC L 6.57   TLC % Predicted % 136   RV % Predicted % 219     No results found for: "NITRICOXIDE"      Assessment & Plan:   Asthmatic bronchitis without complication Compensated on current regimen.  She is clinically stable.  PFTs showed a very mild obstruction with FEV1 of 77%, inflated lung volumes and increased diffusion capacity.  Do suspect that a large component of her cough is related to her continued smoking. Continue on triple therapy with Symbicort and Spiriva.  Continue as needed albuterol.  Continue Singulair and over-the-counter antihistamine for trigger prevention.  Previously discussed biologic therapy given her eosinophilia and frequent flares; decided to hold off. May need to revisit in the future if she has further issues.   Patient Instructions  Continue Symbicort 2 puffs Twice daily, brush tongue and rinse mouth afterwards Continue spiriva 2 puffs daily Continue Albuterol inhaler 2 puffs or 3 mL neb every 6 hours as needed for shortness of breath or wheezing. Notify if symptoms persist despite rescue inhaler/neb use. Continue protonix 40 mg daily  Continue famotidine 20 mg daily  Continue Benzonatate (tessalon perles) 1  capsule Three times  a day as needed for cough Continue singulair 10 mg At bedtime  -Continue Flonase (fluticasone) nasal spray 1 spray per nostril twice a day as needed for nasal congestion.  Continue allergy medicine daily   Start Wellbutrin 150 mg daily for 3 days then increase to 150 mg Twice daily. Continue until you quit smoking and then we will continue for at least another 12 weeks. Monitor your mood and notify of any worsening symptoms.   Continue CPAP nightly, minimum 4-6 hours a night  Adjust settings to 5-12 cmH2O - order sent to medical supply company [facility] someone will contact you for scheduling.  Follow up with Kristin Austin or us in 6 weeks to ensure sleep apnea is better controlled on adjusted settings   Follow up in 4-6 months with Dr. Sherene SiresWert or Philis NettleKatie Alston Berrie,Austin. If symptoms do not improve or worsen, please contact office for sooner follow up or seek emergency care.    Rhinitis, chronic Well-controlled on current regimen. Continue nasal sprays, antihistamines as scheduled.  OSA on CPAP OSA on CPAP. She is managed by Kristin Austin, her PCP, currently. Hasn't been seen for this in a while. Download today shows poor control with residual AHI 12.1 and significant mask leaks. She is currently on set pressure of 12 cmH2O. Having breakthrough fatigue symptoms. Advised adjusting to auto PAP setting 5-12 cmH2O. Set up for mask fitting to ensure she has proper mask fit. She will let us know if Kristin Austin would like to continue to manage her OSA. Will need download in 6 weeks to evaluate for residual events/improvement. Cautioned to be aware of and avoid drowsy driving.  Current smoker We again discussed smoking cessation. She is willing to try Wellbutrin - has rx at home. Again discussed potential side effects.   The patient's current tobacco use: 1 ppd The patient was advised to quit and impact of smoking on their health.  I assessed the patient's willingness to attempt to  quit. I provided methods and skills for cessation. We reviewed medication management of smoking session drugs if appropriate. Resources to help quit smoking were provided. A smoking cessation quit date was set: 05/20/2022 Follow-up was arranged in our clinic.  The amount of time spent counseling patient was 4 mins     I spent 35 minutes of dedicated to the care of this patient on the date of this encounter to include pre-visit review of records, face-to-face time with the patient discussing conditions above, post visit ordering of testing, clinical documentation with the electronic health record, making appropriate referrals as documented, and communicating necessary findings to members of the patients care team.  Noemi ChapelKatherine V Cristy Colmenares, Austin 04/19/2022  Pt aware and understands Austin's role.

## 2022-04-19 NOTE — Progress Notes (Signed)
f °

## 2022-04-19 NOTE — Assessment & Plan Note (Signed)
Compensated on current regimen.  She is clinically stable.  PFTs showed a very mild obstruction with FEV1 of 77%, inflated lung volumes and increased diffusion capacity.  Do suspect that a large component of her cough is related to her continued smoking. Continue on triple therapy with Symbicort and Spiriva.  Continue as needed albuterol.  Continue Singulair and over-the-counter antihistamine for trigger prevention.  Previously discussed biologic therapy given her eosinophilia and frequent flares; decided to hold off. May need to revisit in the future if she has further issues.   Patient Instructions  Continue Symbicort 2 puffs Twice daily, brush tongue and rinse mouth afterwards Continue spiriva 2 puffs daily Continue Albuterol inhaler 2 puffs or 3 mL neb every 6 hours as needed for shortness of breath or wheezing. Notify if symptoms persist despite rescue inhaler/neb use. Continue protonix 40 mg daily  Continue famotidine 20 mg daily  Continue Benzonatate (tessalon perles) 1 capsule Three times a day as needed for cough Continue singulair 10 mg At bedtime  -Continue Flonase (fluticasone) nasal spray 1 spray per nostril twice a day as needed for nasal congestion.  Continue allergy medicine daily   Start Wellbutrin 150 mg daily for 3 days then increase to 150 mg Twice daily. Continue until you quit smoking and then we will continue for at least another 12 weeks. Monitor your mood and notify of any worsening symptoms.   Continue CPAP nightly, minimum 4-6 hours a night  Adjust settings to 5-12 cmH2O - order sent to medical supply company Attend mask fitting at [facility] - someone will contact you for scheduling.  Follow up with Dr. Hilma Favors or [place] in 6 weeks to ensure sleep apnea is better controlled on adjusted settings   Follow up in 4-6 months with Dr. Melvyn Novas or Alanson Aly. If symptoms do not improve or worsen, please contact office for sooner follow up or seek emergency care.

## 2022-04-19 NOTE — Assessment & Plan Note (Signed)
We again discussed smoking cessation. She is willing to try Wellbutrin - has rx at home. Again discussed potential side effects.   The patient's current tobacco use: 1 ppd The patient was advised to quit and impact of smoking on their health.  I assessed the patient's willingness to attempt to quit. I provided methods and skills for cessation. We reviewed medication management of smoking session drugs if appropriate. Resources to help quit smoking were provided. A smoking cessation quit date was set: 05/20/2022 Follow-up was arranged in our clinic.  The amount of time spent counseling patient was 4 mins

## 2022-04-19 NOTE — Assessment & Plan Note (Signed)
Well-controlled on current regimen. Continue nasal sprays, antihistamines as scheduled.

## 2022-04-25 DIAGNOSIS — E1165 Type 2 diabetes mellitus with hyperglycemia: Secondary | ICD-10-CM | POA: Diagnosis not present

## 2022-05-07 ENCOUNTER — Other Ambulatory Visit: Payer: Self-pay | Admitting: Nurse Practitioner

## 2022-05-13 ENCOUNTER — Other Ambulatory Visit: Payer: Self-pay | Admitting: *Deleted

## 2022-05-13 MED ORDER — SPIRIVA RESPIMAT 1.25 MCG/ACT IN AERS: 2.0000 | INHALATION_SPRAY | Freq: Every day | RESPIRATORY_TRACT | 5 refills | Status: DC

## 2022-05-15 ENCOUNTER — Other Ambulatory Visit (HOSPITAL_COMMUNITY): Payer: Self-pay

## 2022-05-15 ENCOUNTER — Telehealth: Payer: Self-pay

## 2022-05-15 NOTE — Telephone Encounter (Deleted)
Patient Advocate Encounter   Received notification from [facility] that prior authorization for Spiriva Respimat 1.25MCG/ACT is required.   PA submitted on 05/15/2022 Key [id number] Status is pending       Kristie Cowman, CPHT Pharmacy Patient Advocate Specialist [facility] Patient Advocate Team Direct Number: [phone] Fax: [phone]

## 2022-05-15 NOTE — Telephone Encounter (Signed)
ERROR

## 2022-05-20 ENCOUNTER — Other Ambulatory Visit (HOSPITAL_BASED_OUTPATIENT_CLINIC_OR_DEPARTMENT_OTHER): Payer: BC Managed Care – PPO

## 2022-05-27 ENCOUNTER — Other Ambulatory Visit: Payer: Self-pay | Admitting: Obstetrics and Gynecology

## 2022-05-27 DIAGNOSIS — Z1231 Encounter for screening mammogram for malignant neoplasm of breast: Secondary | ICD-10-CM

## 2022-05-28 ENCOUNTER — Ambulatory Visit (HOSPITAL_BASED_OUTPATIENT_CLINIC_OR_DEPARTMENT_OTHER): Payer: BC Managed Care – PPO | Attending: Nurse Practitioner

## 2022-05-28 DIAGNOSIS — G4733 Obstructive sleep apnea (adult) (pediatric): Secondary | ICD-10-CM

## 2022-05-30 ENCOUNTER — Ambulatory Visit
Admission: RE | Admit: 2022-05-30 | Discharge: 2022-05-30 | Disposition: A | Discharge status: Home / Self Care | Payer: BC Managed Care – PPO | Source: Ambulatory Visit | Attending: Obstetrics and Gynecology | Admitting: Obstetrics and Gynecology

## 2022-05-30 DIAGNOSIS — Z1231 Encounter for screening mammogram for malignant neoplasm of breast: Secondary | ICD-10-CM

## 2022-05-31 ENCOUNTER — Other Ambulatory Visit: Payer: Self-pay | Admitting: Obstetrics and Gynecology

## 2022-05-31 DIAGNOSIS — R921 Mammographic calcification found on diagnostic imaging of breast: Secondary | ICD-10-CM

## 2022-06-17 IMAGING — DX DG CHEST 2V
2 series · 2 of 2 positions shown · non-contrast
Comparison: 05/04/2019

CLINICAL DATA: Cough.  Asthmatic bronchitis.

EXAM:
CHEST - 2 VIEW

[chest pa]
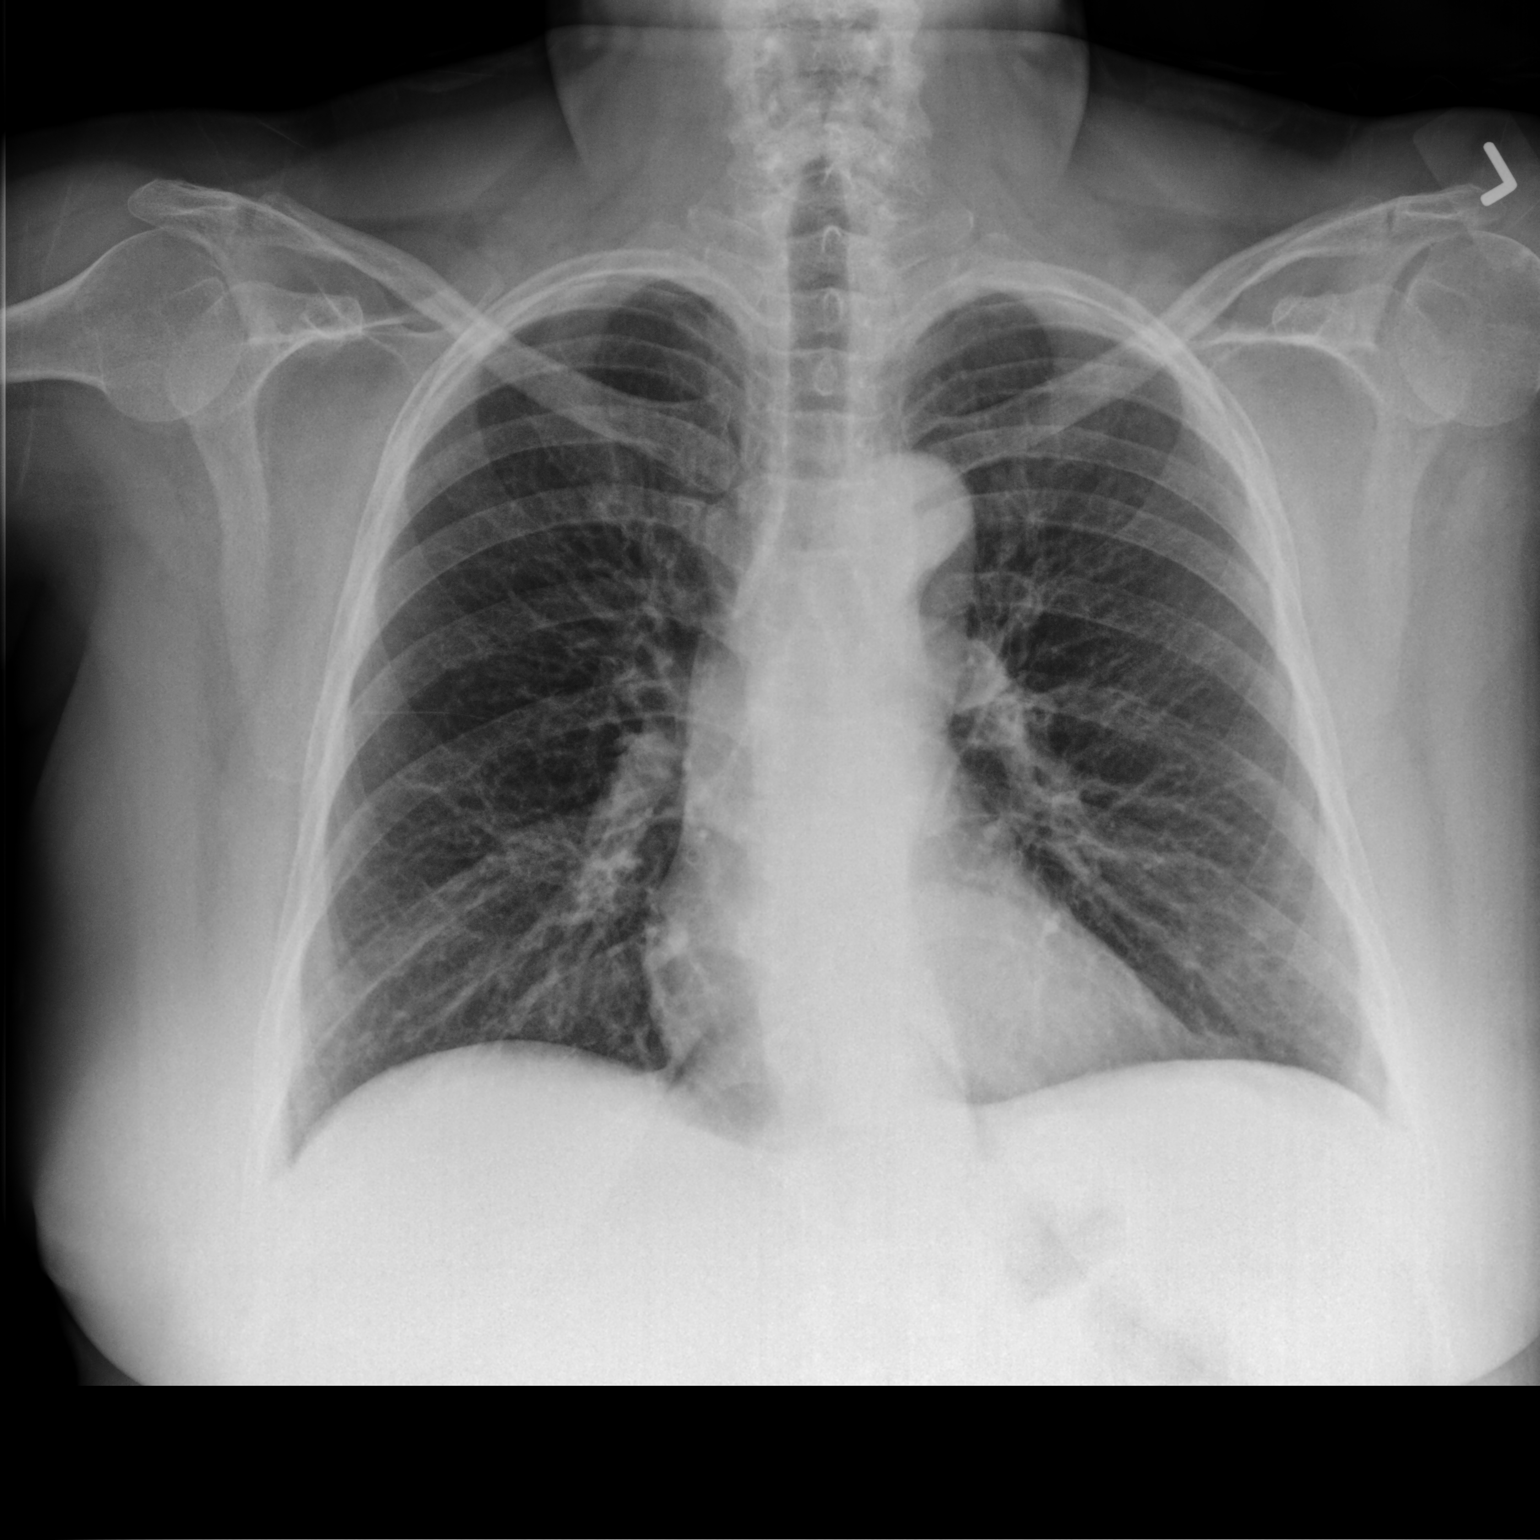

[chest lat]
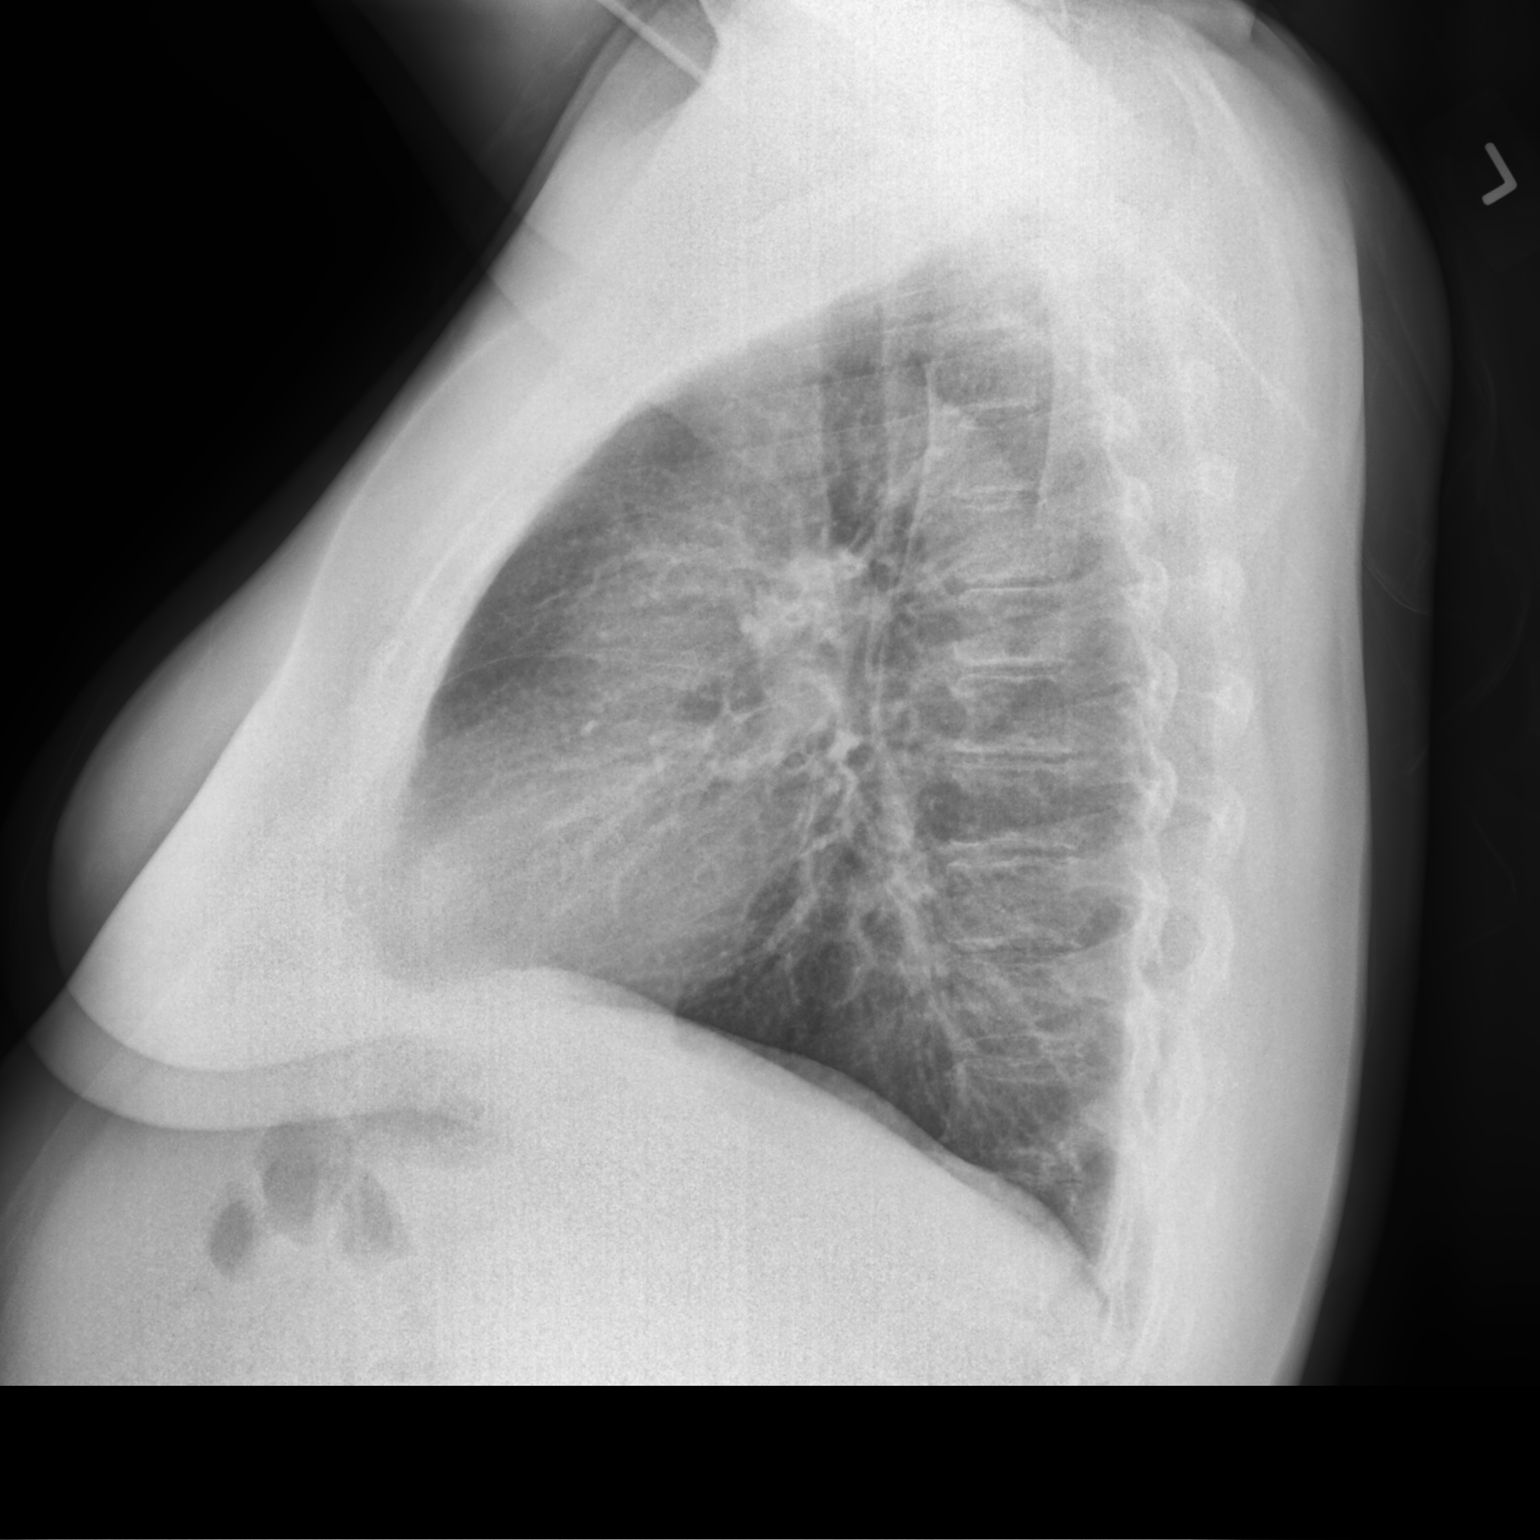

[2 of 2 positions shown; findings below may reference images not displayed]

FINDINGS: Heart size and pulmonary vascularity are normal. Lungs are clear. No
airspace disease or consolidation. Mild peribronchial thickening
compatible with history of asthma. No hilar mass or lymphadenopathy
suggested. No pleural effusions. No pneumothorax. Mediastinal
contours appear intact. Degenerative changes in the spine.
IMPRESSION: No evidence of active pulmonary disease.

## 2022-07-12 ENCOUNTER — Ambulatory Visit
Admission: RE | Admit: 2022-07-12 | Discharge: 2022-07-12 | Disposition: A | Discharge status: Home / Self Care | Payer: BC Managed Care – PPO | Source: Ambulatory Visit | Attending: Obstetrics and Gynecology | Admitting: Obstetrics and Gynecology

## 2022-07-12 DIAGNOSIS — R921 Mammographic calcification found on diagnostic imaging of breast: Secondary | ICD-10-CM | POA: Diagnosis not present

## 2022-08-16 ENCOUNTER — Other Ambulatory Visit: Payer: Self-pay | Admitting: Internal Medicine

## 2022-08-16 ENCOUNTER — Other Ambulatory Visit: Payer: Self-pay | Admitting: Nurse Practitioner

## 2022-09-12 ENCOUNTER — Other Ambulatory Visit: Payer: Self-pay | Admitting: Nurse Practitioner

## 2022-10-17 DIAGNOSIS — E7800 Pure hypercholesterolemia, unspecified: Secondary | ICD-10-CM | POA: Diagnosis not present

## 2022-10-17 DIAGNOSIS — E1165 Type 2 diabetes mellitus with hyperglycemia: Secondary | ICD-10-CM | POA: Diagnosis not present

## 2022-10-17 DIAGNOSIS — G4733 Obstructive sleep apnea (adult) (pediatric): Secondary | ICD-10-CM | POA: Diagnosis not present

## 2022-10-22 DIAGNOSIS — R21 Rash and other nonspecific skin eruption: Secondary | ICD-10-CM | POA: Diagnosis not present

## 2022-10-22 DIAGNOSIS — L409 Psoriasis, unspecified: Secondary | ICD-10-CM | POA: Diagnosis not present

## 2022-10-22 DIAGNOSIS — M1990 Unspecified osteoarthritis, unspecified site: Secondary | ICD-10-CM | POA: Diagnosis not present

## 2022-10-22 DIAGNOSIS — M79641 Pain in right hand: Secondary | ICD-10-CM | POA: Diagnosis not present

## 2022-10-22 DIAGNOSIS — M79642 Pain in left hand: Secondary | ICD-10-CM | POA: Diagnosis not present

## 2022-10-24 DIAGNOSIS — L309 Dermatitis, unspecified: Secondary | ICD-10-CM | POA: Diagnosis not present

## 2022-10-24 DIAGNOSIS — L400 Psoriasis vulgaris: Secondary | ICD-10-CM | POA: Diagnosis not present

## 2022-10-24 DIAGNOSIS — L259 Unspecified contact dermatitis, unspecified cause: Secondary | ICD-10-CM | POA: Diagnosis not present

## 2022-10-25 ENCOUNTER — Ambulatory Visit: Payer: BC Managed Care – PPO | Admitting: Nurse Practitioner

## 2022-11-15 ENCOUNTER — Ambulatory Visit: Payer: BC Managed Care – PPO | Admitting: Nurse Practitioner

## 2023-01-06 ENCOUNTER — Other Ambulatory Visit: Payer: Self-pay | Admitting: Nurse Practitioner

## 2023-02-11 ENCOUNTER — Other Ambulatory Visit: Payer: Self-pay | Admitting: Internal Medicine

## 2023-03-19 ENCOUNTER — Telehealth: Payer: Self-pay | Admitting: Nurse Practitioner

## 2023-03-19 NOTE — Telephone Encounter (Signed)
Patient needs refills on her inhalers Symbicort and Spiriva . She has a currently appointment for November the 4th. She didn't have insurance before but now does.

## 2023-03-21 MED ORDER — SPIRIVA RESPIMAT 1.25 MCG/ACT IN AERS: 2.0000 | INHALATION_SPRAY | Freq: Every day | RESPIRATORY_TRACT | 1 refills | Status: DC

## 2023-03-21 MED ORDER — BUDESONIDE-FORMOTEROL FUMARATE 160-4.5 MCG/ACT IN AERO: 2.0000 | INHALATION_SPRAY | Freq: Two times a day (BID) | RESPIRATORY_TRACT | 1 refills | Status: DC

## 2023-03-21 NOTE — Telephone Encounter (Signed)
Patient's inhalers have been sent to the pharmacy for pt. Pt must keep scheduled appt in order to be able to receive further refills.

## 2023-03-25 ENCOUNTER — Ambulatory Visit: Payer: BC Managed Care – PPO | Admitting: Nurse Practitioner

## 2023-03-25 VITALS — BP 128/68 | HR 72

## 2023-03-25 DIAGNOSIS — R918 Other nonspecific abnormal finding of lung field: Secondary | ICD-10-CM | POA: Diagnosis not present

## 2023-03-25 DIAGNOSIS — F17200 Nicotine dependence, unspecified, uncomplicated: Secondary | ICD-10-CM | POA: Diagnosis not present

## 2023-03-25 DIAGNOSIS — J4540 Moderate persistent asthma, uncomplicated: Secondary | ICD-10-CM

## 2023-03-25 DIAGNOSIS — G4733 Obstructive sleep apnea (adult) (pediatric): Secondary | ICD-10-CM

## 2023-03-25 DIAGNOSIS — J310 Chronic rhinitis: Secondary | ICD-10-CM

## 2023-03-25 MED ORDER — ALBUTEROL SULFATE HFA 108 (90 BASE) MCG/ACT IN AERS: 2.0000 | INHALATION_SPRAY | Freq: Four times a day (QID) | RESPIRATORY_TRACT | 3 refills | Status: DC | PRN

## 2023-03-25 NOTE — Progress Notes (Unsigned)
@Patient  ID: Kristin Austin, female    DOB: 04-22-1966, 57 y.o.   MRN: [id number]  Chief Complaint  Patient presents with   Follow-up    Pt is here for OSA F/U. Pt did not bring SD card. Pt states she has a slight cough, but is okay.    Referring provider: [name], MD  HPI: 57 year old female, active smoker (34 pack years) followed for asthmatic bronchitis and chronic rhinitis.  She is a patient Dr. Thurston Hole and was last seen in office on 01/18/2022 by [facility] NP.  Past medical history significant for obesity, uncontrolled DM, HLD.  Followed by Dr. Suszanne Conners with ENT.  TEST/EVENTS:  05/04/2019: Eosinophils 700, IgE 347 10/18/2021: Eosinophils 1000, IgE 2519 11/01/2021: ANCA and Aspergillus panel negative 11/16/2021 HRCT outside facility: Bronchial wall thickening, consistent with chronic bronchitis. no evidence of underlying interstitial lung disease.  Multiple scattered pulmonary nodules, largest of which measures 5 mm.  Hepatic steatosis.  Several low-attenuation liver lesions, largest is 1.7 cm and likely cyst  07/20/2021: OV with Dr. Sherene Sires.  Treated for exacerbation of asthmatic bronchitis with Depo 120 injection.  Started on Pepcid and Protonix for GERD control.  Started on Singulair at bedtime for trigger prevention.  Concern for possible upper airway component to cough as well.  Continued on Symbicort 80 twice a day.  Patient had irritation and hoarseness with Symbicort 160.  Advised to quit smoking.  10/18/2021: OV with Pearlene Teat NP.  Reported that she started having a bronchitic type cough again around [place] which had persisted.  She also picked back up smoking and feels like this contributed to her symptoms.  She did quit 5 days before this visit.  Increase shortness of breath with exertion.  Cough was productive with yellow sputum.  Felt like allergy symptoms are relatively well controlled.  On Symbicort 80, unable to tolerate 160 in the past.  Also never started Singulair after last visit. FeNO was  borderline elevated.  Treated with prednisone burst and Z-Pak.  Added Spiriva to her regimen given significant smoking history possible COPD component.  Check CBC with differential and IgE, both of which were significantly elevated.  Referral was placed to allergy for further evaluation/management.  Cough control measures advised.  Close follow-up.  11/01/2021: OV with Hetvi Shawhan NP for follow-up.  She continues to have a persistent bronchitic type cough; however, sputum has cleared up some and cough is mostly dry.  She does have shortness of breath with coughing spells; otherwise feels like this has improved some.  Wheezing has also improved. She did pick back up smoking again, due to some stressful life events and finding out her father has cancer, but she has quit now for 5 days. This always seems to exacerbate her symptoms. She denies fevers, night sweats, anorexia, weight loss, lower extremity swelling. Feels like her allergy symptoms have flare back up some with nasal congestion and clear drainage. Last time, we checked a CBC with differential and IgE.  Eosinophils and IgE were significantly elevated.  Referral was placed to allergy for further evaluation. She started on singulair at bedtime. She continues on spiriva and symbicort 80. She is also using flonase nasal spray. FeNO nl and no evidence of bronchospasm so opted against further steroid tx. Counseled to remain smoke free. She does wear a CPAP nightly and is worried she doesn't clean it enough as she should. Further workup for underlying autoimmune inflammatory process vs humidifer lung from CPAP vs eosinophilic pneumonia vs hypersensitivity pneumonitis. ANCA, aspergillus  all negative. HRCT and PFTs ordered for further evaluation. Cough control measures continued.  01/18/2022: OV with Brazil Voytko NP for follow-up after undergoing pulmonary function testing which showed mild obstruction without formal diagnosis of COPD as ratio was 72.  She also had some increased  lung volumes and increased diffusion capacity.  No significant bronchodilator response.  Today, she reports feeling relatively well.  Breathing is overall stable.  She continues to have a chronic cough, which is relatively unchanged compared to when we saw her last.  She has been seen by Dr. Dellis Anes with allergy/asthma who made some adjustments to her allergy regimen.  He also reviewed possible biologic therapy with her given her extensive environmental allergies.  He did not feel that her eosinophil count was high enough to warrant a work-up for Churg-Strauss or Wegener's or other causes of hypereosinophilia. She does continue to smoke 1 pack/day.  She is convinced that this is a large contributing factor to her cough as when she has quit in the past her cough has resolved.  Wants to work on quitting - started on Wellbutrin.  04/19/2022: OV with Avelyn Touch NP for follow up.  She has been doing well since we saw her last.  No flareups of her asthma.  Still has an occasional, chronic cough which is unchanged from baseline.  Does feel like her adjusted allergy regimen has helped.  She continues on Symbicort and Spiriva.  Denies any increased chest congestion or wheezing.  Does not feel like she has any limitations in her activity level.  She does continue to smoke around a pack a day.  Never started the Wellbutrin from last time.  She tells me that she does not really have any excuse and does want to quit.  She is not sure why she never tried it. She brought her SD card in from her CPAP today. Dr. Phillips Odor normally manages this. She wears it every night. Still wakes feeling tired in the morning. She also feels fatigued during the day. She denies any drowsy driving, morning headaches, sleep parasomnias/paralysis. No hx of narcolepsy or cataplexy. She wears a nasal mask. Hasn't changed it out in the last few months.  01/19/2022-04/18/2022 CPAP 12 cmH2O 90/90 days; 100% >4 hr; av use 8 hr 26 min Leaks median 98.2, 95th  141.8 AHI 12.1  03/25/2023: Today - follow up Patient presents today for follow up. She has been doing relatively stable since her last visit. She has not had any exacerbations over the past year requiring steroids or antibiotics. Breathing is doing well for the most part. She still has some shortness of breath with more strenuous activities.   Allergies  Allergen Reactions   Codeine Nausea And Vomiting   Other Other (See Comments)   Sulfa Antibiotics Nausea And Vomiting   Penicillins Rash    Immunization History  Administered Date(s) Administered   PFIZER(Purple Top)SARS-COV-2 Vaccination 01/27/2020, 02/18/2020    Past Medical History:  Diagnosis Date   Allergic rhinitis    Asthma    Depression    Diabetes mellitus without complication (HCC)    OSA on CPAP    Psoriasis     Tobacco History: Social History   Tobacco Use  Smoking Status Some Days   Current packs/day: 1.00   Average packs/day: 1 pack/day for 34.0 years (34.0 ttl pk-yrs)   Types: Cigarettes  Smokeless Tobacco Never  Tobacco Comments   1 ppd 04/19/2022 PAP   Ready to quit: Not Answered Counseling given: Not Answered Tobacco comments:  1 ppd 04/19/2022 PAP   Outpatient Medications Prior to Visit  Medication Sig Dispense Refill   MOUNJARO 7.5 MG/0.5ML Pen Inject 7.5 mg into the skin once a week.     albuterol (ACCUNEB) 1.25 MG/3ML nebulizer solution Take 1 ampule by nebulization every 4 (four) hours as needed for wheezing.     albuterol (VENTOLIN HFA) 108 (90 Base) MCG/ACT inhaler INHALE 1 TO 2 PUFFS INTO LUNGS EVERY 4 HOURS AS NEEDED     budesonide-formoterol (SYMBICORT) 160-4.5 MCG/ACT inhaler Inhale 2 puffs into the lungs 2 (two) times daily. 11 g 1   buPROPion (WELLBUTRIN SR) 150 MG 12 hr tablet Take 1 tablet (150 mg total) by mouth daily for 3 days, THEN 1 tablet (150 mg total) 2 (two) times daily. 171 tablet 0   famotidine (PEPCID) 20 MG tablet TAKE 1 TABLET BY MOUTH AFTER SUPPER 90 tablet 0    fluticasone (FLONASE) 50 MCG/ACT nasal spray Place 2 sprays into both nostrils daily. 16 g 2   ibuprofen (ADVIL,MOTRIN) 800 MG tablet Take 1 tablet (800 mg total) by mouth 3 (three) times daily. (Patient taking differently: Take 800 mg by mouth as needed.) 21 tablet 0   insulin degludec (TRESIBA FLEXTOUCH) 100 UNIT/ML FlexTouch Pen 100 Units.     JARDIANCE 25 MG TABS tablet Take 25 mg by mouth daily.     montelukast (SINGULAIR) 10 MG tablet TAKE 1 TABLET BY MOUTH ONCE DAILY AT BEDTIME AT NIGHT 90 tablet 0   nystatin-triamcinolone (MYCOLOG II) cream nystatin-triamcinolone 100,000 unit/g-0.1 % topical cream  APPLY TO THE AFFECTED AREA BY TOPICAL ROUTE 2 TIMES PER DAY IN THE MORNING AND EVENING     pantoprazole (PROTONIX) 40 MG tablet TAKE 1 TABLET BY MOUTH ONCE DAILY 30  TO  60  MINUTES  BEFORE  FIRST  MEAL  OF  THE  DAY. 30 tablet 2   Potassium 99 MG TABS Take 1 tablet by mouth every other day.     Tiotropium Bromide Monohydrate (SPIRIVA RESPIMAT) 1.25 MCG/ACT AERS Inhale 2 puffs into the lungs daily. 4 g 1   triamcinolone (KENALOG) 0.025 % ointment triamcinolone acetonide 0.025 % topical ointment  APPLY OINTMENT TOPICALLY TO AFFECTED AREA TWICE DAILY AS NEEDED FOR ITCHING     benzonatate (TESSALON) 100 MG capsule Take 2 capsules (200 mg total) by mouth 3 (three) times daily as needed for cough. (Patient not taking: Reported on 04/19/2022) 30 capsule 1   Olopatadine-Mometasone (RYALTRIS) 665-25 MCG/ACT SUSP 2 sprays per nostril 1-2 times daily as needed 29 g 5   tirzepatide (MOUNJARO) 5 MG/0.5ML Pen 5 mg.     No facility-administered medications prior to visit.     Review of Systems:   Constitutional: No weight loss or gain, night sweats, fevers, chills, or lassitude. +daytime fatigue  HEENT: No headaches, difficulty swallowing, tooth/dental problems, or sore throat. No sneezing, itching, ear ache +nasal congestion/drainage (improved) CV:  No chest pain, orthopnea, PND, swelling in lower  extremities, anasarca, dizziness, palpitations, syncope Resp: +dry, chronic cough. No shortness of breath with exertion or at rest. No excess mucus or change in color of mucus.No hemoptysis. No wheeze. No chest wall deformity GI:  No heartburn, indigestion, abdominal pain, nausea, vomiting, diarrhea, change in bowel habits, loss of appetite, bloody stools.  Skin: No rash, lesions, ulcerations MSK:  No joint pain or swelling.  No decreased range of motion.  No back pain. Neuro: No dizziness or lightheadedness.  Psych: No depression or anxiety. Mood stable.  Physical Exam:  There were no vitals taken for this visit.  GEN: Pleasant, interactive, well-appearing; obese; in no acute distress. HEENT:  Normocephalic and atraumatic. PERRLA. Sclera white. Nasal turbinates pink, moist and patent bilaterally. No rhinorrhea present. Oropharynx pink and moist, without exudate or edema. No lesions, ulcerations, or postnasal drip. Mallampati II NECK:  Supple w/ fair ROM. No JVD present. Normal carotid impulses w/o bruits. Thyroid symmetrical with no goiter or nodules palpated. No lymphadenopathy.   CV: RRR, no m/r/g, no peripheral edema. Pulses intact, +2 bilaterally. No cyanosis, pallor or clubbing. PULMONARY:  Unlabored, regular breathing.  Clear bilaterally A&P without wheezes/rales/rhonchi. No accessory muscle use. No dullness to percussion. GI: BS present and normoactive. Soft, non-tender to palpation. No organomegaly or masses detected.  MSK: No erythema, warmth or tenderness. Cap refil <2 sec all extrem. No deformities or joint swelling noted.  Neuro: A/Ox3. No focal deficits noted.   Skin: Warm, no lesions or rashe Psych: Normal affect and behavior. Judgement and thought content appropriate.     Lab Results:  CBC    Component Value Date/Time   WBC 10.7 (H) 10/18/2021 1539   RBC 5.39 (H) 10/18/2021 1539   HGB 15.2 (H) 10/18/2021 1539   HCT 45.0 10/18/2021 1539   PLT 283.0 10/18/2021  1539   MCV 83.5 10/18/2021 1539   MCHC 33.7 10/18/2021 1539   RDW 14.2 10/18/2021 1539   LYMPHSABS 2.3 10/18/2021 1539   MONOABS 0.8 10/18/2021 1539   EOSABS 1.0 (H) 10/18/2021 1539   BASOSABS 0.1 10/18/2021 1539    BMET No results found for: "NA", "K", "CL", "CO2", "GLUCOSE", "BUN", "CREATININE", "CALCIUM", "GFRNONAA", "GFRAA"  BNP No results found for: "BNP"   Imaging:  No results found.  Administration History     None          Latest Ref Rng & Units 01/18/2022    3:03 PM  PFT Results  FVC-Pre L 2.53   FVC-Predicted Pre % 78   FVC-Post L 2.69   FVC-Predicted Post % 83   Pre FEV1/FVC % % 78   Post FEV1/FCV % % 72   FEV1-Pre L 1.98   FEV1-Predicted Pre % 78   FEV1-Post L 1.95   DLCO uncorrected ml/min/mmHg 25.05   DLCO UNC% % 128   DLCO corrected ml/min/mmHg 25.05   DLCO COR %Predicted % 128   DLVA Predicted % 132   TLC L 6.57   TLC % Predicted % 136   RV % Predicted % 219     No results found for: "NITRICOXIDE"      Assessment & Plan:   No problem-specific Assessment & Plan notes found for this encounter.     I spent 35 minutes of dedicated to the care of this patient on the date of this encounter to include pre-visit review of records, face-to-face time with the patient discussing conditions above, post visit ordering of testing, clinical documentation with the electronic health record, making appropriate referrals as documented, and communicating necessary findings to members of the patients care team.  Noemi Chapel, NP 03/25/2023  Pt aware and understands NP's role.

## 2023-03-25 NOTE — Patient Instructions (Addendum)
Continue Symbicort 2 puffs Twice daily, brush tongue and rinse mouth afterwards Continue spiriva 2 puffs daily Continue Albuterol inhaler 2 puffs or 3 mL neb every 6 hours as needed for shortness of breath or wheezing. Notify if symptoms persist despite rescue inhaler/neb use. Continue protonix 40 mg daily  Continue famotidine 20 mg daily  Continue singulair 10 mg At bedtime  Continue Flonase (fluticasone) nasal spray 1 spray per nostril twice a day as needed for nasal congestion.  Continue allergy medicine daily   Start Wellbutrin 150 mg daily for 3 days then increase to 150 mg Twice daily. Continue until you quit smoking and then we will continue for at least another 12 weeks. Monitor your mood and notify of any worsening symptoms.    Continue CPAP nightly, minimum 4-6 hours a night  Take CPAP machine to [facility] to see if they can pull a download off of this. If not, they need to give you a SD card for your machine   Referred to lung cancer screening program again. They should contact you to schedule this within the next month or so    Follow up in 6 weeks with Dr. Sherene Sires or Katie Harce Volden,NP. If symptoms do not improve or worsen, please contact office for sooner follow up or seek emergency care.

## 2023-03-26 ENCOUNTER — Encounter: Payer: Self-pay | Admitting: Nurse Practitioner

## 2023-03-26 NOTE — Assessment & Plan Note (Signed)
Compensated on current regimen.  No recent exacerbations requiring steroids or antibiotics.  No hospitalizations.  Action plan in place.  Smoking cessation advised.  Patient Instructions  Continue Symbicort 2 puffs Twice daily, brush tongue and rinse mouth afterwards Continue spiriva 2 puffs daily Continue Albuterol inhaler 2 puffs or 3 mL neb every 6 hours as needed for shortness of breath or wheezing. Notify if symptoms persist despite rescue inhaler/neb use. Continue protonix 40 mg daily  Continue famotidine 20 mg daily  Continue singulair 10 mg At bedtime  Continue Flonase (fluticasone) nasal spray 1 spray per nostril twice a day as needed for nasal congestion.  Continue allergy medicine daily   Start Wellbutrin 150 mg daily for 3 days then increase to 150 mg Twice daily. Continue until you quit smoking and then we will continue for at least another 12 weeks. Monitor your mood and notify of any worsening symptoms.    Continue CPAP nightly, minimum 4-6 hours a night  Take CPAP machine to [facility] to see if they can pull a download off of this. If not, they need to give you a SD card for your machine   Referred to lung cancer screening program again. They should contact you to schedule this within the next month or so    Follow up in 6 weeks with Dr. Sherene Sires or Katie Ianmichael Amescua,NP. If symptoms do not improve or worsen, please contact office for sooner follow up or seek emergency care.

## 2023-03-26 NOTE — Assessment & Plan Note (Signed)
Last CT 10/2021.  Referred to lung cancer screening program.  She has not followed up with this.  Will replace referral today.

## 2023-03-26 NOTE — Assessment & Plan Note (Signed)
Excellent compliance per her report.  Still having some daytime fatigue.  At her last visit, download had showed breakthrough events with AHI 12.1/h; however, she was also having significant leakage so felt that this was related.  She has since gotten a new machine.  No download available.  She will take this to the medical supply company to see if they can pull information off of the device or provide her with SD card.  Aware of proper care/use.  Safe driving practices reviewed.

## 2023-03-26 NOTE — Assessment & Plan Note (Signed)
Stable on current regimen

## 2023-03-26 NOTE — Assessment & Plan Note (Addendum)
The patient's current tobacco use: 1 ppd The patient was advised to quit and impact of smoking on their health.  I assessed the patient's willingness to attempt to quit. I provided methods and skills for cessation. We reviewed medication management of smoking session drugs if appropriate. Starting Wellbutrin. Medication education provided and side effect profile reviewed. Understands to monitor mood and notify of any negative changes.  Resources to help quit smoking were provided. A smoking cessation quit date was set: 04/25/2023 Follow-up was arranged in our clinic.  The amount of time spent counseling patient was 4 mins

## 2023-05-04 IMAGING — DX DG CHEST 2V
2 series · 2 of 2 positions shown · non-contrast
Comparison: X-ray chest 07/21/2020.

CLINICAL DATA: Cough.

EXAM:
CHEST - 2 VIEW

[chest pa]
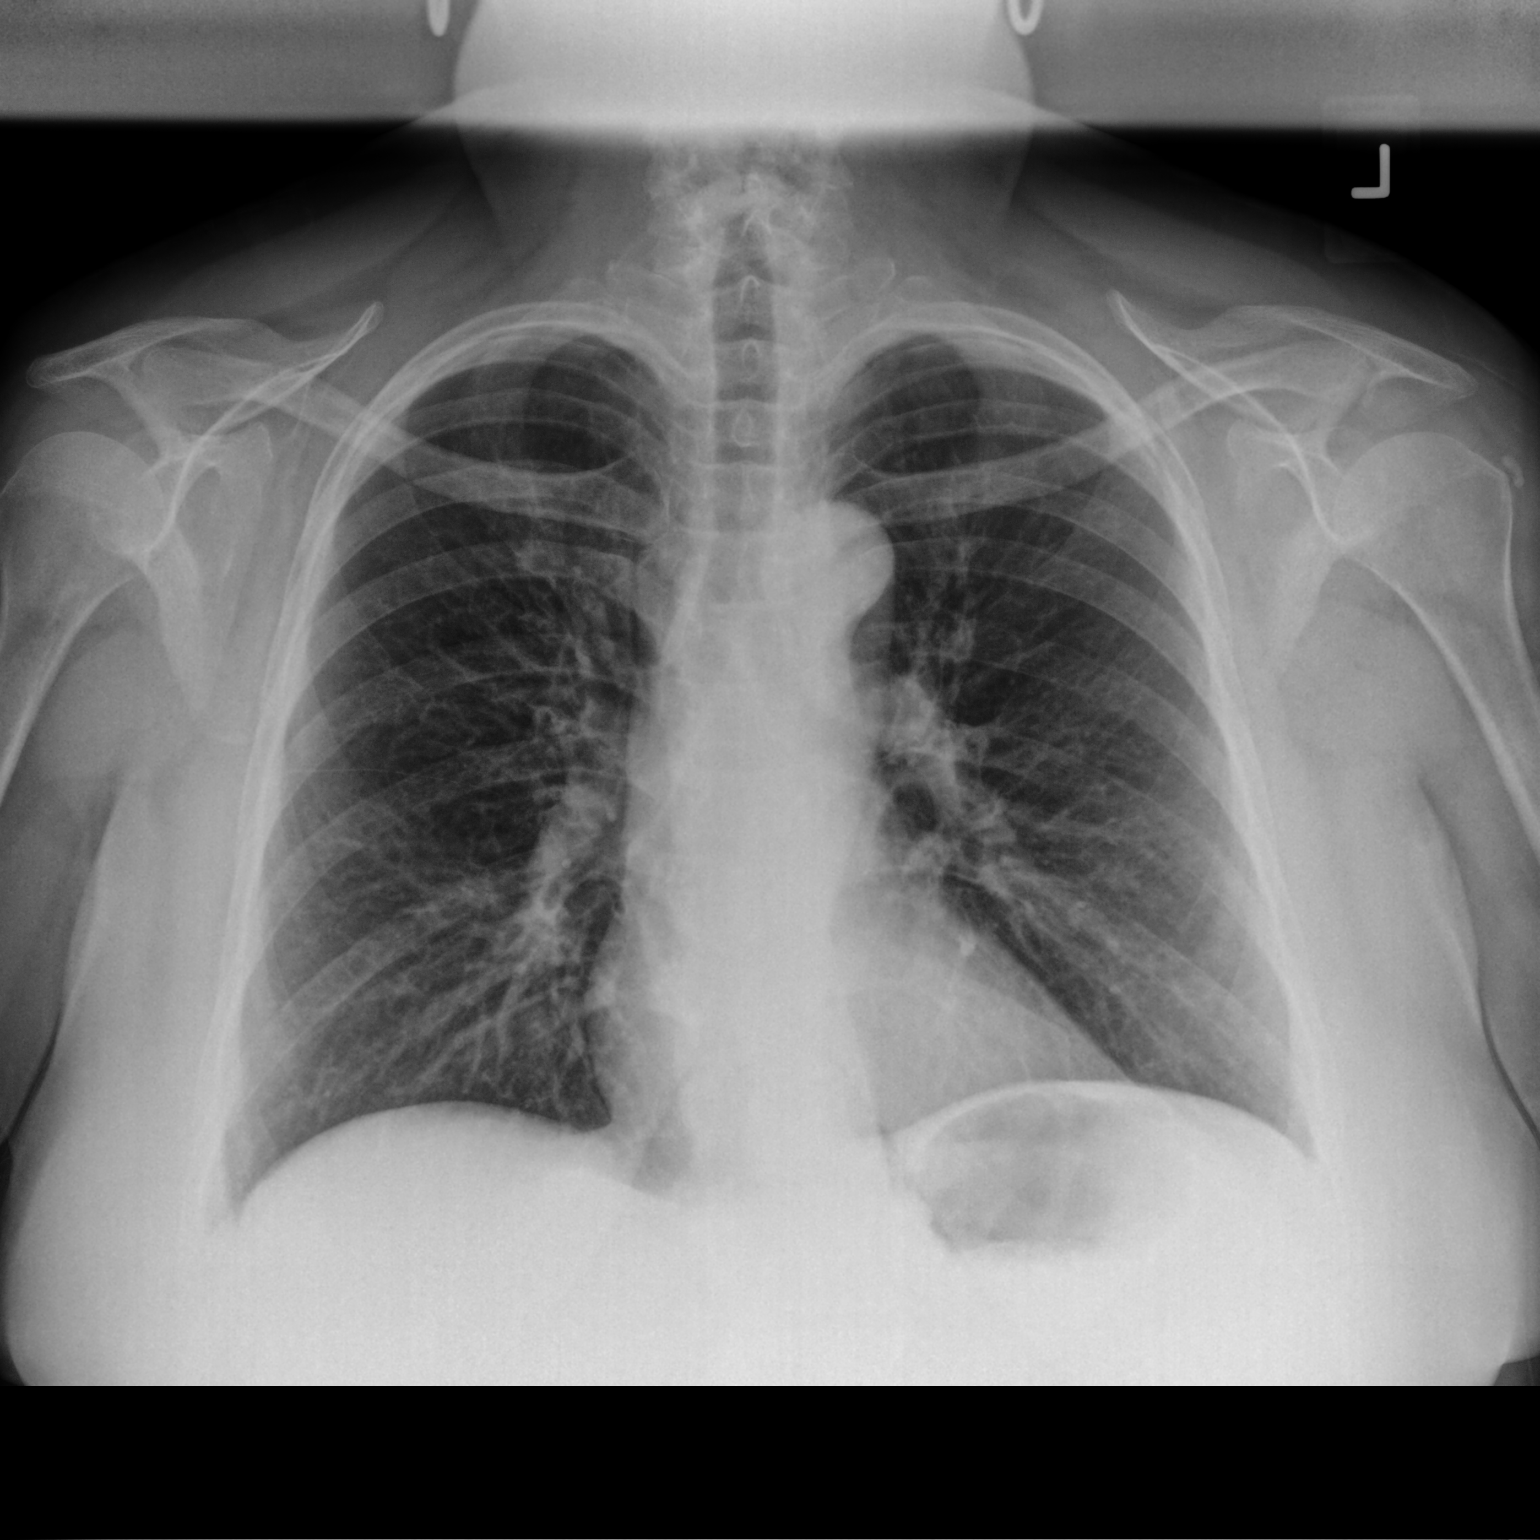

[chest lat]
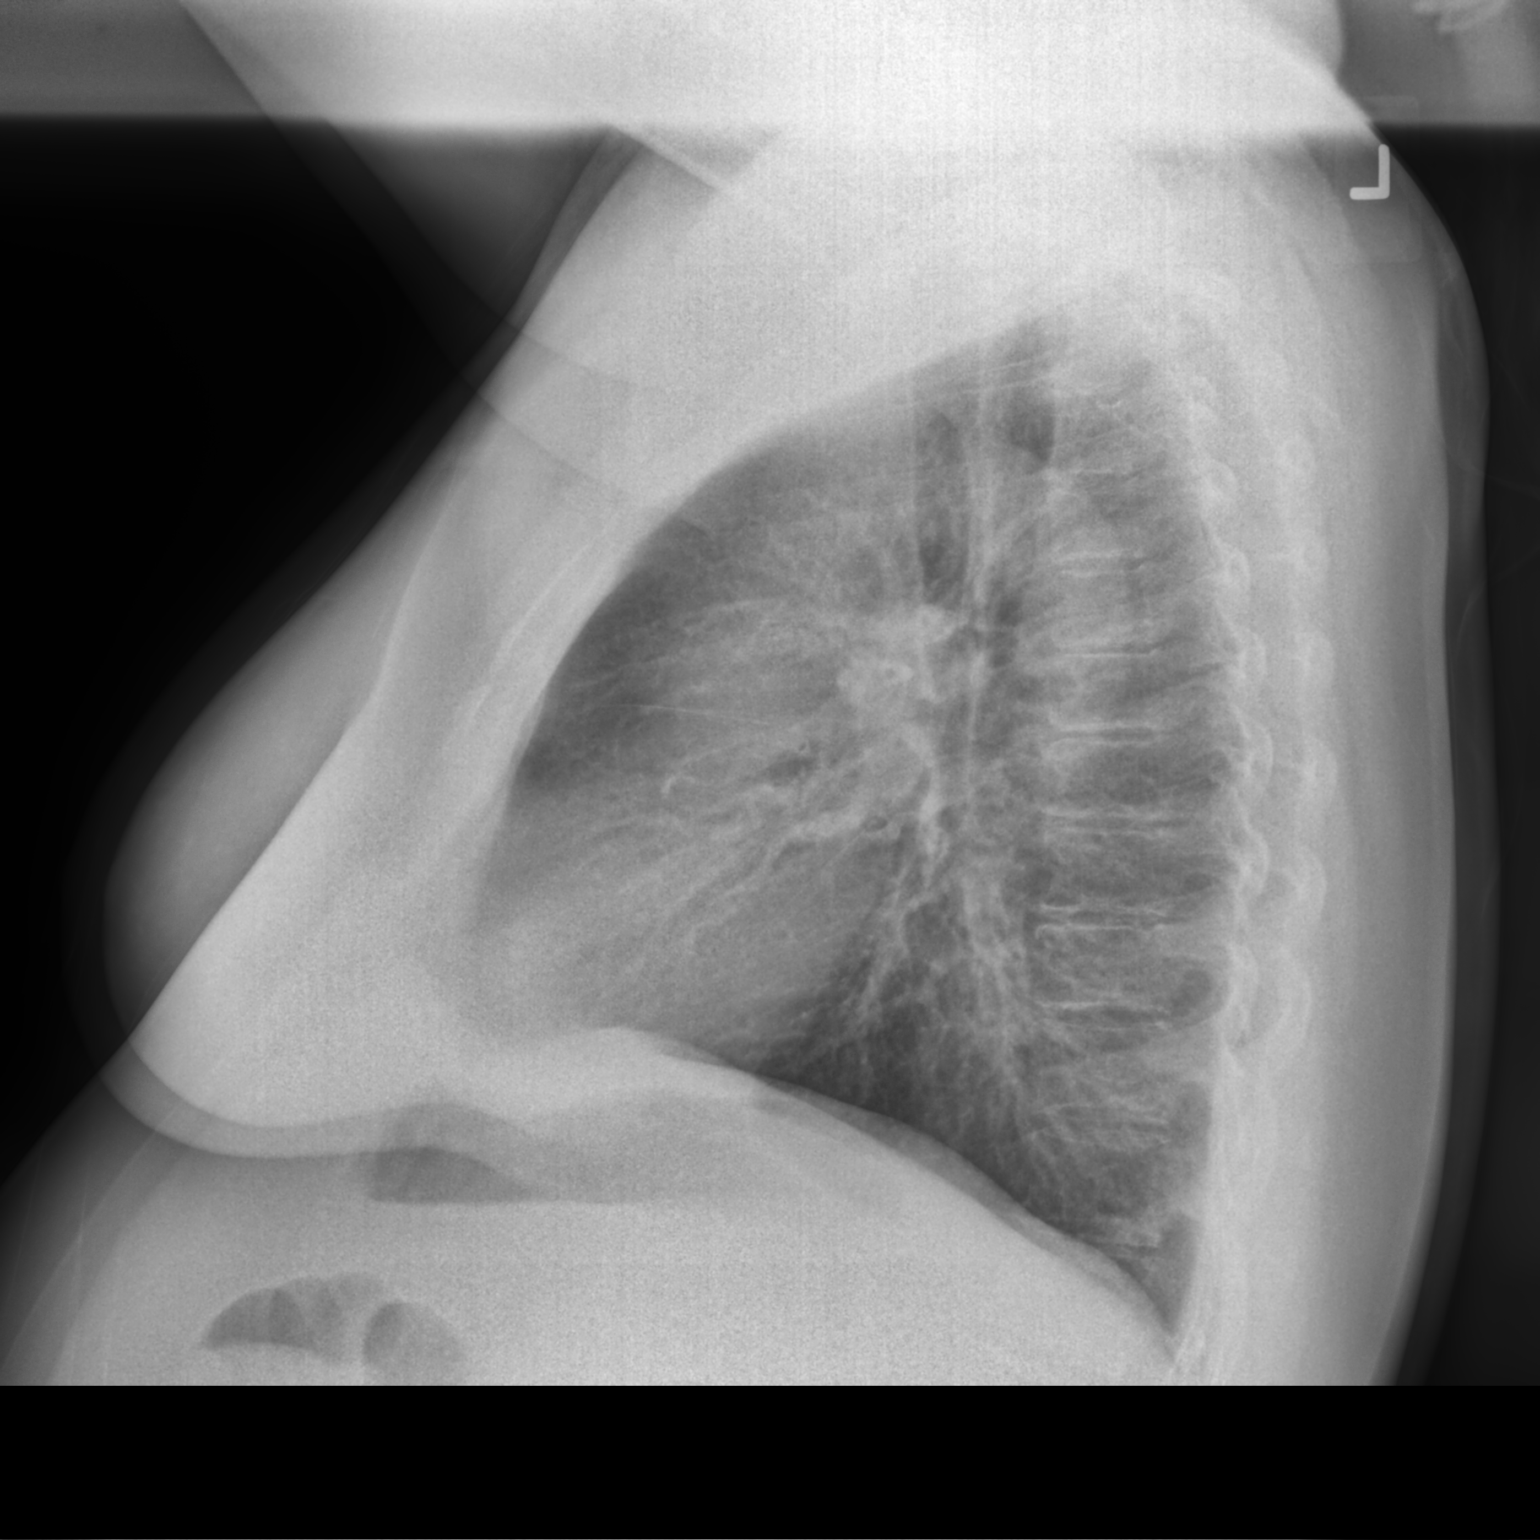

[2 of 2 positions shown; findings below may reference images not displayed]

FINDINGS: The heart size and mediastinal contours are within normal limits.
Similar degree of central peribronchial cuffing. No focal airspace
consolidation, pleural effusion, or pneumothorax. Calcific
tendinopathy of the left rotator cuff incidentally noted. The
visualized skeletal structures are unremarkable.
IMPRESSION: 1. Similar mild bronchitic lung changes without focal airspace
consolidation.
2. Calcific tendinopathy of the left rotator cuff incidentally
noted.

## 2023-05-05 ENCOUNTER — Ambulatory Visit: Payer: Self-pay | Admitting: Nurse Practitioner

## 2023-05-13 ENCOUNTER — Other Ambulatory Visit: Payer: Self-pay | Admitting: Internal Medicine

## 2023-05-16 ENCOUNTER — Telehealth: Payer: BC Managed Care – PPO | Admitting: Nurse Practitioner

## 2023-05-26 ENCOUNTER — Other Ambulatory Visit: Payer: Self-pay | Admitting: Nurse Practitioner

## 2023-06-21 ENCOUNTER — Other Ambulatory Visit: Payer: Self-pay | Admitting: Nurse Practitioner

## 2023-06-23 ENCOUNTER — Other Ambulatory Visit: Payer: Self-pay | Admitting: Nurse Practitioner

## 2023-07-08 ENCOUNTER — Ambulatory Visit (INDEPENDENT_AMBULATORY_CARE_PROVIDER_SITE_OTHER): Payer: BC Managed Care – PPO

## 2023-07-08 ENCOUNTER — Ambulatory Visit: Payer: BC Managed Care – PPO | Admitting: Nurse Practitioner

## 2023-07-08 ENCOUNTER — Encounter: Payer: Self-pay | Admitting: Nurse Practitioner

## 2023-07-08 VITALS — BP 122/84 | HR 102 | Temp 97.3°F | Ht 63.0 in | Wt 205.2 lb

## 2023-07-08 DIAGNOSIS — R059 Cough, unspecified: Secondary | ICD-10-CM | POA: Diagnosis not present

## 2023-07-08 DIAGNOSIS — J310 Chronic rhinitis: Secondary | ICD-10-CM | POA: Diagnosis not present

## 2023-07-08 DIAGNOSIS — F17200 Nicotine dependence, unspecified, uncomplicated: Secondary | ICD-10-CM | POA: Diagnosis not present

## 2023-07-08 DIAGNOSIS — J4541 Moderate persistent asthma with (acute) exacerbation: Secondary | ICD-10-CM | POA: Diagnosis not present

## 2023-07-08 DIAGNOSIS — R918 Other nonspecific abnormal finding of lung field: Secondary | ICD-10-CM

## 2023-07-08 DIAGNOSIS — G4733 Obstructive sleep apnea (adult) (pediatric): Secondary | ICD-10-CM

## 2023-07-08 DIAGNOSIS — R058 Other specified cough: Secondary | ICD-10-CM | POA: Diagnosis not present

## 2023-07-08 LAB — POCT EXHALED NITRIC OXIDE: FeNO level (ppb): 13

## 2023-07-08 MED ORDER — ALBUTEROL SULFATE (2.5 MG/3ML) 0.083% IN NEBU
2.5000 mg | INHALATION_SOLUTION | Freq: Once | RESPIRATORY_TRACT | Status: AC
  Administered 2023-07-08: 2.5 mg via RESPIRATORY_TRACT

## 2023-07-08 MED ORDER — METHYLPREDNISOLONE ACETATE 80 MG/ML IJ SUSP
80.0000 mg | Freq: Once | INTRAMUSCULAR | Status: AC
  Administered 2023-07-08: 80 mg via INTRAMUSCULAR

## 2023-07-08 MED ORDER — PROMETHAZINE-DM 6.25-15 MG/5ML PO SYRP: 5.0000 mL | ORAL_SOLUTION | Freq: Four times a day (QID) | ORAL | 0 refills | Status: AC | PRN

## 2023-07-08 MED ORDER — SPIRIVA RESPIMAT 1.25 MCG/ACT IN AERS: 2.0000 | INHALATION_SPRAY | Freq: Every day | RESPIRATORY_TRACT | 11 refills | Status: AC

## 2023-07-08 MED ORDER — PREDNISONE 10 MG PO TABS: ORAL_TABLET | ORAL | 0 refills | Status: DC

## 2023-07-08 MED ORDER — AZITHROMYCIN 250 MG PO TABS: ORAL_TABLET | ORAL | 0 refills | Status: DC

## 2023-07-08 MED ORDER — BENZONATATE 200 MG PO CAPS: 200.0000 mg | ORAL_CAPSULE | Freq: Three times a day (TID) | ORAL | 1 refills | Status: DC | PRN

## 2023-07-08 MED ORDER — SYMBICORT 160-4.5 MCG/ACT IN AERO: 2.0000 | INHALATION_SPRAY | Freq: Two times a day (BID) | RESPIRATORY_TRACT | 11 refills | Status: AC

## 2023-07-08 NOTE — Progress Notes (Signed)
 @Patient  ID: Kristin Austin, female    DOB: Nov 19, 1965, 58 y.o.   MRN: [id number]  Chief Complaint  Patient presents with   Follow-up    Dry and wet cough- slight chest tightness and chest congestion Having to gasp for air, not getting enough air     Referring provider: Marvine Rush, MD  HPI: 58 year old female, active smoker (34 pack years) followed for asthmatic bronchitis and chronic rhinitis.  She is a patient Dr. Chari and was last seen in office on 03/25/2023 by [facility] NP.  Past medical history significant for obesity, uncontrolled DM, HLD.  Followed by Dr. Karis with ENT.  TEST/EVENTS:  05/04/2019: Eosinophils 700, IgE 347 10/18/2021: Eosinophils 1000, IgE 2519 11/01/2021: ANCA and Aspergillus panel negative 11/16/2021 HRCT outside facility: Bronchial wall thickening, consistent with chronic bronchitis. no evidence of underlying interstitial lung disease.  Multiple scattered pulmonary nodules, largest of which measures 5 mm.  Hepatic steatosis.  Several low-attenuation liver lesions, largest is 1.7 cm and likely cyst  07/20/2021: OV with Dr. Darlean.  Treated for exacerbation of asthmatic bronchitis with Depo 120 injection.  Started on Pepcid  and Protonix  for GERD control.  Started on Singulair  at bedtime for trigger prevention.  Concern for possible upper airway component to cough as well.  Continued on Symbicort  80 twice a day.  Patient had irritation and hoarseness with Symbicort  160.  Advised to quit smoking.  10/18/2021: OV with Zarina Pe NP.  Reported that she started having a bronchitic type cough again around [place] which had persisted.  She also picked back up smoking and feels like this contributed to her symptoms.  She did quit 5 days before this visit.  Increase shortness of breath with exertion.  Cough was productive with yellow sputum.  Felt like allergy  symptoms are relatively well controlled.  On Symbicort  80, unable to tolerate 160 in the past.  Also never started Singulair  after last  visit. FeNO was borderline elevated.  Treated with prednisone  burst and Z-Pak.  Added Spiriva  to her regimen given significant smoking history possible COPD component.  Check CBC with differential and IgE, both of which were significantly elevated.  Referral was placed to allergy  for further evaluation/management.  Cough control measures advised.  Close follow-up.  11/01/2021: OV with Kenna Kirn NP for follow-up.  She continues to have a persistent bronchitic type cough; however, sputum has cleared up some and cough is mostly dry.  She does have shortness of breath with coughing spells; otherwise feels like this has improved some.  Wheezing has also improved. She did pick back up smoking again, due to some stressful life events and finding out her father has cancer, but she has quit now for 5 days. This always seems to exacerbate her symptoms. She denies fevers, night sweats, anorexia, weight loss, lower extremity swelling. Feels like her allergy  symptoms have flare back up some with nasal congestion and clear drainage. Last time, we checked a CBC with differential and IgE.  Eosinophils and IgE were significantly elevated.  Referral was placed to allergy  for further evaluation. She started on singulair  at bedtime. She continues on spiriva  and symbicort  80. She is also using flonase  nasal spray. FeNO nl and no evidence of bronchospasm so opted against further steroid tx. Counseled to remain smoke free. She does wear a CPAP nightly and is worried she doesn't clean it enough as she should. Further workup for underlying autoimmune inflammatory process vs humidifer lung from CPAP vs eosinophilic pneumonia vs hypersensitivity pneumonitis. ANCA, aspergillus all negative.  HRCT and PFTs ordered for further evaluation. Cough control measures continued.  01/18/2022: OV with Aloura Matsuoka NP for follow-up after undergoing pulmonary function testing which showed mild obstruction without formal diagnosis of COPD as ratio was 72.  She also had  some increased lung volumes and increased diffusion capacity.  No significant bronchodilator response.  Today, she reports feeling relatively well.  Breathing is overall stable.  She continues to have a chronic cough, which is relatively unchanged compared to when we saw her last.  She has been seen by Dr. Iva with allergy enrique who made some adjustments to her allergy  regimen.  He also reviewed possible biologic therapy with her given her extensive environmental allergies.  He did not feel that her eosinophil count was high enough to warrant a work-up for Churg-Strauss or Wegener's or other causes of hypereosinophilia. She does continue to smoke 1 pack/day.  She is convinced that this is a large contributing factor to her cough as when she has quit in the past her cough has resolved.  Wants to work on quitting - started on Wellbutrin .  04/19/2022: OV with Rayson Rando NP for follow up.  She has been doing well since we saw her last.  No flareups of her asthma.  Still has an occasional, chronic cough which is unchanged from baseline.  Does feel like her adjusted allergy  regimen has helped.  She continues on Symbicort  and Spiriva .  Denies any increased chest congestion or wheezing.  Does not feel like she has any limitations in her activity level.  She does continue to smoke around a pack a day.  Never started the Wellbutrin  from last time.  She tells me that she does not really have any excuse and does want to quit.  She is not sure why she never tried it. She brought her SD card in from her CPAP today. Dr. Marvine normally manages this. She wears it every night. Still wakes feeling tired in the morning. She also feels fatigued during the day. She denies any drowsy driving, morning headaches, sleep parasomnias/paralysis. No hx of narcolepsy or cataplexy. She wears a nasal mask. Hasn't changed it out in the last few months.  01/19/2022-04/18/2022 CPAP 12 cmH2O 90/90 days; 100% >4 hr; av use 8 hr 26 min Leaks  median 98.2, 95th 141.8 AHI 12.1  03/25/2023: OV with Mayvis Agudelo NP for follow up. She has been doing relatively stable since her last visit. She has not had any exacerbations over the past year requiring steroids or antibiotics. Breathing is doing well for the most part. She still has some shortness of breath with more strenuous activities, which is baseline for her. Occasional cough with clear to white phlegm, unchanged. No chest congestion, wheezing, chills, hemoptysis.  Uses her albuterol  a few times a week.  Still smoking.  Never started Wellbutrin  after her last visit.  Would like to try this now. Wearing CPAP nightly. Does not have SD card with her. She's not sure her machine has one. She ended up getting a new machine from her friend. Sleeping well with it for the most part. Still tired some days. Denies drowsy driving.   07/08/2023: Today - follow up Patient presents today for follow up. She has been having more trouble with a cough for the last 2 months. Feels like it's progressively worsened. Mostly non-productive but occasionally gets up some yellow to green phlegm. She's now having more difficulties with shortness of breath and chest tightness. Also noticing more wheezing. She denies any fevers, chills, hemoptysis,  leg swelling, calf pain. She does have some nasal congestion and clear drainage. No facial tenderness. She is not using the flonase  nasal spray. Using her symbicort  and spiriva . Having to use her albuterol  multiple times a day and neb once a day.  She never started the Wellbutrin . She does want to quit smoking. She plans to start this.  She is wearing CPAP at night. She receives benefit from use. Still needs to take her CPAP to [place] for download. No drowsy driving.  Allergies  Allergen Reactions   Codeine  Nausea And Vomiting   Other Other (See Comments)   Sulfa Antibiotics Nausea And Vomiting   Penicillins Rash    Immunization History  Administered Date(s)  Administered   PFIZER(Purple Top)SARS-COV-2 Vaccination 01/27/2020, 02/18/2020    Past Medical History:  Diagnosis Date   Allergic rhinitis    Asthma    Depression    Diabetes mellitus without complication (HCC)    OSA on CPAP    Psoriasis     Tobacco History: Social History   Tobacco Use  Smoking Status Some Days   Current packs/day: 1.00   Average packs/day: 1 pack/day for 34.0 years (34.0 ttl pk-yrs)   Types: Cigarettes  Smokeless Tobacco Never  Tobacco Comments   1 ppd 04/19/2022 PAP   Ready to quit: No Counseling given: Yes Tobacco comments: 1 ppd 04/19/2022 PAP   Outpatient Medications Prior to Visit  Medication Sig Dispense Refill   albuterol  (ACCUNEB ) 1.25 MG/3ML nebulizer solution Take 1 ampule by nebulization every 4 (four) hours as needed for wheezing.     albuterol  (VENTOLIN  HFA) 108 (90 Base) MCG/ACT inhaler Inhale 2 puffs into the lungs every 6 (six) hours as needed for wheezing or shortness of breath. 8 g 3   famotidine  (PEPCID ) 20 MG tablet TAKE 1 TABLET BY MOUTH AFTER SUPPER 90 tablet 0   ibuprofen  (ADVIL ,MOTRIN ) 800 MG tablet Take 1 tablet (800 mg total) by mouth 3 (three) times daily. (Patient taking differently: Take 800 mg by mouth as needed.) 21 tablet 0   insulin degludec (TRESIBA FLEXTOUCH) 100 UNIT/ML FlexTouch Pen 100 Units.     montelukast  (SINGULAIR ) 10 MG tablet TAKE 1 TABLET BY MOUTH ONCE DAILY AT BEDTIME AT NIGHT 90 tablet 0   nystatin-triamcinolone (MYCOLOG II) cream nystatin-triamcinolone 100,000 unit/g-0.1 % topical cream  APPLY TO THE AFFECTED AREA BY TOPICAL ROUTE 2 TIMES PER DAY IN THE MORNING AND EVENING     pantoprazole  (PROTONIX ) 40 MG tablet TAKE 1 TABLET BY MOUTH ONCE DAILY 30  TO  60  MINUTES  BEFORE  FIRST  MEAL  OF  THE  DAY. 30 tablet 2   triamcinolone (KENALOG) 0.025 % ointment triamcinolone acetonide 0.025 % topical ointment  APPLY OINTMENT TOPICALLY TO AFFECTED AREA TWICE DAILY AS NEEDED FOR ITCHING     SPIRIVA  RESPIMAT 1.25  MCG/ACT AERS INHALE 2 SPRAY(S) BY MOUTH ONCE DAILY 4 g 0   SYMBICORT  160-4.5 MCG/ACT inhaler Inhale 2 puffs by mouth twice daily 11 g 0   buPROPion  (WELLBUTRIN  SR) 150 MG 12 hr tablet Take 1 tablet (150 mg total) by mouth daily for 3 days, THEN 1 tablet (150 mg total) 2 (two) times daily. 171 tablet 0   fluticasone  (FLONASE ) 50 MCG/ACT nasal spray Place 2 sprays into both nostrils daily. (Patient not taking: Reported on 07/08/2023) 16 g 2   JARDIANCE 25 MG TABS tablet Take 25 mg by mouth daily. (Patient not taking: Reported on 07/08/2023)     MOUNJARO 7.5  MG/0.5ML Pen Inject 7.5 mg into the skin once a week. (Patient not taking: Reported on 07/08/2023)     Potassium 99 MG TABS Take 1 tablet by mouth every other day.     No facility-administered medications prior to visit.     Review of Systems:   Constitutional: No weight loss or gain, night sweats, fevers, chills, or lassitude. +daytime fatigue  HEENT: No headaches, difficulty swallowing, tooth/dental problems, or sore throat. No sneezing, itching, ear ache +nasal congestion/drainage CV:  No chest pain, orthopnea, PND, swelling in lower extremities, anasarca, dizziness, palpitations, syncope Resp: +cough; shortness of breath with exertion; wheezing; chest tightness. No hemoptysis. No chest wall deformity GI:  No heartburn, indigestion, abdominal pain, nausea, vomiting, diarrhea, change in bowel habits, loss of appetite, bloody stools.  Skin: No rash, lesions, ulcerations MSK:  No joint pain or swelling.   Neuro: No dizziness or lightheadedness.  Psych: No depression or anxiety. Mood stable.     Physical Exam:  BP 122/84 (BP Location: Right Arm, Patient Position: Sitting, Cuff Size: Large)   Pulse (!) 102   Temp (!) 97.3 F (36.3 C) (Temporal)   Ht 5' 3 (1.6 m)   Wt 205 lb 3.2 oz (93.1 kg)   LMP  (LMP Unknown)   SpO2 95%   BMI 36.35 kg/m   GEN: Pleasant, interactive, well-appearing; obese; in no acute distress. HEENT:   Normocephalic and atraumatic. PERRLA. Sclera white. Nasal turbinates pink, moist and patent bilaterally. No rhinorrhea present. Oropharynx pink and moist, without exudate or edema. No lesions, ulcerations, or postnasal drip. Mallampati II NECK:  Supple w/ fair ROM. No JVD present. Normal carotid impulses w/o bruits. Thyroid  symmetrical with no goiter or nodules palpated. No lymphadenopathy.   CV: RRR, no m/r/g, no peripheral edema. Pulses intact, +2 bilaterally. No cyanosis, pallor or clubbing. PULMONARY:  Unlabored, regular breathing. Scattered rhonchi b/l bases otherwise clear bilaterally A&P. Bronchitic cough. No accessory muscle use. No dullness to percussion. GI: BS present and normoactive. Soft, non-tender to palpation. No organomegaly or masses detected.  MSK: No erythema, warmth or tenderness. Cap refil <2 sec all extrem. No deformities or joint swelling noted.  Neuro: A/Ox3. No focal deficits noted.   Skin: Warm, no lesions or rashe Psych: Normal affect and behavior. Judgement and thought content appropriate.     Lab Results:  CBC    Component Value Date/Time   WBC 10.7 (H) 10/18/2021 1539   RBC 5.39 (H) 10/18/2021 1539   HGB 15.2 (H) 10/18/2021 1539   HCT 45.0 10/18/2021 1539   PLT 283.0 10/18/2021 1539   MCV 83.5 10/18/2021 1539   MCHC 33.7 10/18/2021 1539   RDW 14.2 10/18/2021 1539   LYMPHSABS 2.3 10/18/2021 1539   MONOABS 0.8 10/18/2021 1539   EOSABS 1.0 (H) 10/18/2021 1539   BASOSABS 0.1 10/18/2021 1539    BMET No results found for: NA, K, CL, CO2, GLUCOSE, BUN, CREATININE, CALCIUM , GFRNONAA, GFRAA  BNP No results found for: BNP   Imaging:  DG Chest 2 View Result Date: 07/08/2023 CLINICAL DATA:  58 year old female with cough and shortness of breath. EXAM: CHEST - 2 VIEW COMPARISON:  Chest radiographs 11/01/2021 and earlier. FINDINGS: PA and lateral views. Lung volumes and mediastinal contours remain normal. Visualized tracheal air column is  within normal limits. Lung markings appear stable since 2022, both lungs appear clear. No pleural effusion. No acute osseous abnormality identified. Negative visible bowel gas. IMPRESSION: Negative.  No acute cardiopulmonary abnormality. Electronically Signed  By: VEAR Hurst M.D.   On: 07/08/2023 10:34    albuterol  (PROVENTIL ) (2.5 MG/3ML) 0.083% nebulizer solution 2.5 mg     Date Action Dose Route User   07/08/2023 0949 Given 2.5 mg Nebulization Busick, Ashlyn T, CMA      methylPREDNISolone  acetate (DEPO-MEDROL ) injection 80 mg     Date Action Dose Route User   07/08/2023 0953 Given 80 mg Intramuscular (Left Deltoid) Busick, Ashlyn T, CMA          Latest Ref Rng & Units 01/18/2022    3:03 PM  PFT Results  FVC-Pre L 2.53   FVC-Predicted Pre % 78   FVC-Post L 2.69   FVC-Predicted Post % 83   Pre FEV1/FVC % % 78   Post FEV1/FCV % % 72   FEV1-Pre L 1.98   FEV1-Predicted Pre % 78   FEV1-Post L 1.95   DLCO uncorrected ml/min/mmHg 25.05   DLCO UNC% % 128   DLCO corrected ml/min/mmHg 25.05   DLCO COR %Predicted % 128   DLVA Predicted % 132   TLC L 6.57   TLC % Predicted % 136   RV % Predicted % 219     No results found for: NITRICOXIDE      Assessment & Plan:   Asthmatic bronchitis with acute exacerbation Acute asthmatic bronchitis exacerbation. Will treat with depo 80 mg inj x 1, prednisone  taper, albuterol  neb in office x 1. Given length of symptoms and progressive worsening, will cover for atypical infection with azithromycin . CXR today. Continue triple therapy regimen. Action plan in place.  Patient Instructions  Continue Symbicort  2 puffs Twice daily, brush tongue and rinse mouth afterwards Continue spiriva  2 puffs daily Continue Albuterol  inhaler 2 puffs or 3 mL neb every 6 hours as needed for shortness of breath or wheezing. Notify if symptoms persist despite rescue inhaler/neb use. Use nebs 2-3 times a day until symptoms improve  Continue protonix  40 mg daily   Continue famotidine  20 mg daily  Continue singulair  10 mg At bedtime  Restart Flonase  (fluticasone ) nasal spray 1-2 spray per nostril twice a day as needed for nasal congestion.  Continue allergy  medicine daily  Prednisone  taper. 4 tabs for 2 days, then 3 tabs for 2 days, 2 tabs for 2 days, then 1 tab for 2 days, then stop. Take in AM with food. Start tomorrow Benzonatate  1 capsule Three times a day for cough. Use consistently over the next few days then as needed Promethazine  DM cough syrup 5 mL every 6 hours as needed for cough. May cause drowsiness. Do not drive after taking Azithromycin  (z pack) - take 2 tablets on day one then 1 tablet daily for four additional days. Take with food    Start Wellbutrin  150 mg daily for 3 days then increase to 150 mg Twice daily. Continue until you quit smoking and then we will continue for at least another 12 weeks. Monitor your mood and notify of any worsening symptoms.    Continue CPAP nightly, minimum 4-6 hours a night  Take CPAP machine to [facility] to see if they can pull a download off of this. If not, they need to give you a SD card for your machine    I will follow up on the lung cancer screening referral   Chest x ray today    Follow up in 3-4 weeks with Dr. Darlean or Izetta Malachy PIETY. If symptoms do not improve or worsen, please contact office for sooner follow up or seek  emergency care.   Rhinitis, chronic Restart intranasal steroid. Likely contributing to cough   Current smoker Smoking cessation again discussed. Time spent 3 min. She is planning to start Wellbutrin , which I previously prescribed. Will let us  know if she needs updated rx. Side effect profile reviewed.   Lung nodules Small, scattered nodules. Last CT 10/2021. Will follow up on lung cancer screening referral with program coordinator.   OSA on CPAP OSA on CPAP. She requested to switch to our care for management at her last OV. Unable to review download - needs to  take to DME. Aware of proper use/care and risks of untreated OSA. Safe driving practices reviewed. Receives benefit from use.   I spent 42 minutes of dedicated to the care of this patient on the date of this encounter to include pre-visit review of records, face-to-face time with the patient discussing conditions above, post visit ordering of testing, clinical documentation with the electronic health record, making appropriate referrals as documented, and communicating necessary findings to members of the patients care team.  Comer LULLA Rouleau, NP 07/08/2023  Pt aware and understands NP's role.

## 2023-07-08 NOTE — Patient Instructions (Addendum)
 Continue Symbicort  2 puffs Twice daily, brush tongue and rinse mouth afterwards Continue spiriva  2 puffs daily Continue Albuterol  inhaler 2 puffs or 3 mL neb every 6 hours as needed for shortness of breath or wheezing. Notify if symptoms persist despite rescue inhaler/neb use. Use nebs 2-3 times a day until symptoms improve  Continue protonix  40 mg daily  Continue famotidine  20 mg daily  Continue singulair  10 mg At bedtime  Restart Flonase  (fluticasone ) nasal spray 1-2 spray per nostril twice a day as needed for nasal congestion.  Continue allergy  medicine daily  Prednisone  taper. 4 tabs for 2 days, then 3 tabs for 2 days, 2 tabs for 2 days, then 1 tab for 2 days, then stop. Take in AM with food. Start tomorrow Benzonatate  1 capsule Three times a day for cough. Use consistently over the next few days then as needed Promethazine  DM cough syrup 5 mL every 6 hours as needed for cough. May cause drowsiness. Do not drive after taking Azithromycin  (z pack) - take 2 tablets on day one then 1 tablet daily for four additional days. Take with food    Start Wellbutrin  150 mg daily for 3 days then increase to 150 mg Twice daily. Continue until you quit smoking and then we will continue for at least another 12 weeks. Monitor your mood and notify of any worsening symptoms.    Continue CPAP nightly, minimum 4-6 hours a night  Take CPAP machine to [facility] to see if they can pull a download off of this. If not, they need to give you a SD card for your machine    I will follow up on the lung cancer screening referral   Chest x ray today    Follow up in 3-4 weeks with Kristin Austin or Kristin Austin. If symptoms do not improve or worsen, please contact office for sooner follow up or seek emergency care.

## 2023-07-08 NOTE — Assessment & Plan Note (Signed)
 OSA on CPAP. She requested to switch to our care for management at her last OV. Unable to review download - needs to take to DME. Aware of proper use/care and risks of untreated OSA. Safe driving practices reviewed. Receives benefit from use.

## 2023-07-08 NOTE — Assessment & Plan Note (Signed)
 Small, scattered nodules. Last CT 10/2021. Will follow up on lung cancer screening referral with program coordinator.

## 2023-07-08 NOTE — Assessment & Plan Note (Signed)
 Acute asthmatic bronchitis exacerbation. Will treat with depo 80 mg inj x 1, prednisone  taper, albuterol  neb in office x 1. Given length of symptoms and progressive worsening, will cover for atypical infection with azithromycin . CXR today. Continue triple therapy regimen. Action plan in place.  Patient Instructions  Continue Symbicort  2 puffs Twice daily, brush tongue and rinse mouth afterwards Continue spiriva  2 puffs daily Continue Albuterol  inhaler 2 puffs or 3 mL neb every 6 hours as needed for shortness of breath or wheezing. Notify if symptoms persist despite rescue inhaler/neb use. Use nebs 2-3 times a day until symptoms improve  Continue protonix  40 mg daily  Continue famotidine  20 mg daily  Continue singulair  10 mg At bedtime  Restart Flonase  (fluticasone ) nasal spray 1-2 spray per nostril twice a day as needed for nasal congestion.  Continue allergy  medicine daily  Prednisone  taper. 4 tabs for 2 days, then 3 tabs for 2 days, 2 tabs for 2 days, then 1 tab for 2 days, then stop. Take in AM with food. Start tomorrow Benzonatate  1 capsule Three times a day for cough. Use consistently over the next few days then as needed Promethazine  DM cough syrup 5 mL every 6 hours as needed for cough. May cause drowsiness. Do not drive after taking Azithromycin  (z pack) - take 2 tablets on day one then 1 tablet daily for four additional days. Take with food    Start Wellbutrin  150 mg daily for 3 days then increase to 150 mg Twice daily. Continue until you quit smoking and then we will continue for at least another 12 weeks. Monitor your mood and notify of any worsening symptoms.    Continue CPAP nightly, minimum 4-6 hours a night  Take CPAP machine to [facility] to see if they can pull a download off of this. If not, they need to give you a SD card for your machine    I will follow up on the lung cancer screening referral   Chest x ray today    Follow up in 3-4 weeks with Dr. Darlean or Izetta Malachy PIETY. If symptoms do not improve or worsen, please contact office for sooner follow up or seek emergency care.

## 2023-07-08 NOTE — Assessment & Plan Note (Addendum)
 Restart intranasal steroid. Likely contributing to cough

## 2023-07-08 NOTE — Assessment & Plan Note (Signed)
 Smoking cessation again discussed. Time spent 3 min. She is planning to start Wellbutrin, which I previously prescribed. Will let us know if she needs updated rx. Side effect profile reviewed.

## 2023-07-15 DIAGNOSIS — E669 Obesity, unspecified: Secondary | ICD-10-CM | POA: Diagnosis not present

## 2023-07-15 DIAGNOSIS — G4733 Obstructive sleep apnea (adult) (pediatric): Secondary | ICD-10-CM | POA: Diagnosis not present

## 2023-07-15 DIAGNOSIS — E7800 Pure hypercholesterolemia, unspecified: Secondary | ICD-10-CM | POA: Diagnosis not present

## 2023-07-15 DIAGNOSIS — E1165 Type 2 diabetes mellitus with hyperglycemia: Secondary | ICD-10-CM | POA: Diagnosis not present

## 2023-07-16 LAB — COMPREHENSIVE METABOLIC PANEL WITH GFR
Albumin: 4.5 (ref 3.5–5.0)
Albumin: 4.5 (ref 3.5–5.0)
Calcium: 10 (ref 8.7–10.7)
Calcium: 10 (ref 8.7–10.7)
Globulin: 2.5
Globulin: 2.5
eGFR: 94
eGFR: 94

## 2023-07-16 LAB — LIPID PANEL
Cholesterol: 301 — AB (ref 0–200)
HDL: 35 (ref 35–70)
LDL Cholesterol: 203
Triglycerides: 309 — AB (ref 40–160)

## 2023-07-16 LAB — BASIC METABOLIC PANEL WITH GFR
BUN: 14 (ref 4–21)
BUN: 19 (ref 4–21)
CO2: 21 (ref 13–22)
CO2: 21 (ref 13–22)
Chloride: 97 — AB (ref 99–108)
Chloride: 97 — AB (ref 99–108)
Creatinine: 0.7 (ref 0.5–1.1)
Creatinine: 0.7 (ref 0.5–1.1)
Glucose: 286
Glucose: 286
Potassium: 4.6 meq/L (ref 3.5–5.1)
Potassium: 4.6 meq/L (ref 3.5–5.1)
Sodium: 136 — AB (ref 137–147)
Sodium: 136 — AB (ref 137–147)

## 2023-07-16 LAB — HEPATIC FUNCTION PANEL
ALT: 45 U/L — AB (ref 7–35)
AST: 36 — AB (ref 13–35)
Alkaline Phosphatase: 167 — AB (ref 25–125)
Bilirubin, Total: 0.3

## 2023-07-16 LAB — HEMOGLOBIN A1C: Hemoglobin A1C: 10.4

## 2023-08-10 ENCOUNTER — Other Ambulatory Visit: Payer: Self-pay | Admitting: Internal Medicine

## 2023-08-20 ENCOUNTER — Ambulatory Visit: Payer: BC Managed Care – PPO | Admitting: Nurse Practitioner

## 2023-08-27 ENCOUNTER — Ambulatory Visit: Payer: BC Managed Care – PPO | Admitting: Internal Medicine

## 2023-08-27 ENCOUNTER — Encounter: Payer: Self-pay | Admitting: Internal Medicine

## 2023-08-27 VITALS — BP 132/70 | HR 95 | Temp 98.1°F

## 2023-08-27 DIAGNOSIS — R768 Other specified abnormal immunological findings in serum: Secondary | ICD-10-CM

## 2023-08-27 DIAGNOSIS — J4541 Moderate persistent asthma with (acute) exacerbation: Secondary | ICD-10-CM

## 2023-08-27 DIAGNOSIS — J310 Chronic rhinitis: Secondary | ICD-10-CM

## 2023-08-27 DIAGNOSIS — D721 Eosinophilia, unspecified: Secondary | ICD-10-CM

## 2023-08-27 LAB — NITRIC OXIDE: Nitric Oxide: 12

## 2023-08-27 MED ORDER — PREDNISONE 10 MG PO TABS: ORAL_TABLET | ORAL | 0 refills | Status: DC

## 2023-08-27 MED ORDER — DOXYCYCLINE HYCLATE 100 MG PO TABS: 100.0000 mg | ORAL_TABLET | Freq: Two times a day (BID) | ORAL | 0 refills | Status: DC

## 2023-08-27 MED ORDER — METHYLPREDNISOLONE ACETATE 80 MG/ML IJ SUSP
80.0000 mg | Freq: Once | INTRAMUSCULAR | Status: AC
  Administered 2023-08-27: 80 mg via INTRAMUSCULAR

## 2023-08-27 NOTE — Patient Instructions (Addendum)
 ICD-10-CM   1. Moderate persistent asthmatic bronchitis with acute exacerbation  J45.41 Nitric oxide    2. Eosinophilia, unspecified type  D72.10     3. Elevated IgE level  R76.8     4. Rhinitis, chronic  J31.0      Having a case of acute on chronic bronchitis  Plan -Do CBC with differential and blood IgE   -Ideally do it before you take any steroids  -Results from this will help inform potential add-on treatments. - IM Depo-Medrol 80 mg x 1 dose in the office per your request 08/27/2023 - Then from 08/28/23  - Please take prednisone 40 mg x1 day, then 30 mg x1 day, then 20 mg x1 day, then 10 mg x1 day, and  STOP -Take doxycycline 100mg  po twice daily x 5 days; take after meals and avoid sunlight  Followup  - With Katie Cobb,OR Dr.  Sandrea Hughs  -Do recommend you discuss CT scan of the sinus with them given your chronic rhinitis.

## 2023-08-27 NOTE — Addendum Note (Signed)
 Addended by: Christen Butter on: 08/27/2023 04:45 PM   Modules accepted: Orders

## 2023-08-27 NOTE — Progress Notes (Signed)
 OV 08/27/2023  Subjective:  Patient ID: Kristin Austin, female , DOB: Feb 12, 1966 , age 58 y.o. , MRN: [id number] , ADDRESS: [address] [place] [address] PCP [name], MD Patient Care Team: [name], MD as PCP - General [facility] Medicine)  This Provider for this visit: Treatment Team:  Attending Provider: Kalman Shan, MD    08/27/2023 -   Chief Complaint  Patient presents with   Acute Visit    Cough with green sputum, wheezing and increased SOB past several wks. Feno=12 today.      HPI Kristin Austin 58 y.o. -acute visit for Dr. Tressia Danas patient who is known to have chronic cough and also asthma with elevated IgE.  She gives a history of having skin allergy testing that was positive with an allergist in [place] a few years ago.  Last CT scan of the chest was in 2023.  Elevated IgE of greater than 2020 23.  She says she saw Rhunette Croft in January 2025 given Z-Pak and prednisone.  She also got Depo-Medrol at that time.  At that time the symptoms were cough that was dry and had a mucus also when she was talking she choked.  She has chronic sinus congestion in the nostrils.  She got better but shortly after started having progressive worsening cough.  She feeling tired she also has low appetite which she attributes to [facility].  She has never had a sinus CT.  Last CT chest was 2 years ago no fever no wheezing but she feels bronchitis is coming back.  She is worried about prednisone but she knows it has helped her.  She is requesting IM Depo-Medrol in the office today.  We took a shared decision making for her to do a short course of prednisone.  I recommended CT scan of the sinus but she is worried about cost issues so she will talk about this with nurse practitioner follow-up  I have recommended a CBC with differential and blood IgE today and she is agreed to do this.     Latest Reference Range & Units 05/04/19 10:20 10/18/21 15:39  11/01/21 14:30  IgE (Immunoglobulin E), Serum <OR=114 kU/L 347 (H) 2,519 (H)   A-1 Antitrypsin, Ser 83 - 199 mg/dL 756    ANCA SCREEN Negative    Negative  (H): Data is abnormally high PFT     Latest Ref Rng & Units 01/18/2022    3:03 PM  PFT Results  FVC-Pre L 2.53   FVC-Predicted Pre % 78   FVC-Post L 2.69   FVC-Predicted Post % 83   Pre FEV1/FVC % % 78   Post FEV1/FCV % % 72   FEV1-Pre L 1.98   FEV1-Predicted Pre % 78   FEV1-Post L 1.95   DLCO uncorrected ml/min/mmHg 25.05   DLCO UNC% % 128   DLCO corrected ml/min/mmHg 25.05   DLCO COR %Predicted % 128   DLVA Predicted % 132   TLC L 6.57   TLC % Predicted % 136   RV % Predicted % 219        LAB RESULTS last 96 hours No results found.       has a past medical history of Allergic rhinitis, Asthma, Depression, Diabetes mellitus without complication (HCC), OSA on CPAP, and Psoriasis.   reports that she has been smoking cigarettes. She has a 34 pack-year smoking history. She has never used smokeless tobacco.  Past Surgical History:  Procedure  Laterality Date   COLONOSCOPY      Allergies  Allergen Reactions   Codeine Nausea And Vomiting   Other Other (See Comments)   Sulfa Antibiotics Nausea And Vomiting   Penicillins Rash    Immunization History  Administered Date(s) Administered   PFIZER(Purple Top)SARS-COV-2 Vaccination 01/27/2020, 02/18/2020    Family History  Problem Relation Age of Onset   Cancer Mother    Diabetes Mellitus II Mother    Hypertension Mother    Prostate cancer Father    Diabetes Mellitus II Father    Hypertension Sister    Allergic rhinitis Neg Hx    Asthma Neg Hx    Eczema Neg Hx    Urticaria Neg Hx      Current Outpatient Medications:    albuterol (ACCUNEB) 1.25 MG/3ML nebulizer solution, Take 1 ampule by nebulization every 4 (four) hours as needed for wheezing., Disp: , Rfl:    albuterol (VENTOLIN HFA) 108 (90 Base) MCG/ACT inhaler, Inhale 2 puffs into the lungs every  6 (six) hours as needed for wheezing or shortness of breath., Disp: 8 g, Rfl: 3   doxycycline (VIBRA-TABS) 100 MG tablet, Take 1 tablet (100 mg total) by mouth 2 (two) times daily., Disp: 5 tablet, Rfl: 0   famotidine (PEPCID) 20 MG tablet, TAKE 1 TABLET BY MOUTH AFTER SUPPER, Disp: 90 tablet, Rfl: 0   ibuprofen (ADVIL,MOTRIN) 800 MG tablet, Take 1 tablet (800 mg total) by mouth 3 (three) times daily. (Patient taking differently: Take 800 mg by mouth as needed.), Disp: 21 tablet, Rfl: 0   insulin degludec (TRESIBA FLEXTOUCH) 100 UNIT/ML FlexTouch Pen, 100 Units., Disp: , Rfl:    montelukast (SINGULAIR) 10 MG tablet, TAKE 1 TABLET BY MOUTH ONCE DAILY NIGHTLY AT BEDTIME, Disp: 90 tablet, Rfl: 0   nystatin-triamcinolone (MYCOLOG II) cream, nystatin-triamcinolone 100,000 unit/g-0.1 % topical cream  APPLY TO THE AFFECTED AREA BY TOPICAL ROUTE 2 TIMES PER DAY IN THE MORNING AND EVENING, Disp: , Rfl:    pantoprazole (PROTONIX) 40 MG tablet, TAKE 1 TABLET BY MOUTH ONCE DAILY 30  TO  60  MINUTES  BEFORE  FIRST  MEAL  OF  THE  DAY., Disp: 30 tablet, Rfl: 2   predniSONE (DELTASONE) 10 MG tablet, Please take prednisone 40 mg x1 day, then 30 mg x1 day, then 20 mg x1 day, then 10 mg x1 day,  and stop, Disp: 9 tablet, Rfl: 0   promethazine-dextromethorphan (PROMETHAZINE-DM) 6.25-15 MG/5ML syrup, Take 5 mLs by mouth 4 (four) times daily as needed for cough., Disp: 180 mL, Rfl: 0   SYMBICORT 160-4.5 MCG/ACT inhaler, Inhale 2 puffs into the lungs 2 (two) times daily., Disp: 6 g, Rfl: 11   Tiotropium Bromide Monohydrate (SPIRIVA RESPIMAT) 1.25 MCG/ACT AERS, Inhale 2 puffs into the lungs daily., Disp: 4 g, Rfl: 11   triamcinolone (KENALOG) 0.025 % ointment, triamcinolone acetonide 0.025 % topical ointment  APPLY OINTMENT TOPICALLY TO AFFECTED AREA TWICE DAILY AS NEEDED FOR ITCHING, Disp: , Rfl:    benzonatate (TESSALON) 200 MG capsule, Take 1 capsule (200 mg total) by mouth 3 (three) times daily as needed for cough.  (Patient not taking: Reported on 08/27/2023), Disp: 30 capsule, Rfl: 1      Objective:   Vitals:   08/27/23 1516  BP: 132/70  Pulse: 95  Temp: 98.1 F (36.7 C)  TempSrc: Oral  SpO2: 97%    Estimated body mass index is 36.35 kg/m as calculated from the following:   Height as of 07/08/23:  5\' 3"  (1.6 m).   Weight as of 07/08/23: 205 lb 3.2 oz (93.1 kg).  @WEIGHTCHANGE @  There were no vitals filed for this visit.   Physical Exam   General: No distress. Obese , nasal twang in voice O2 at rest: no Cane present: no Sitting in wheel chair: no Frail: no Obese: yes Neuro: Alert and Oriented x 3. GCS 15. Speech normal Psych: Pleasant Resp:  Barrel Chest - no.  Wheeze - no, Crackles - no, No overt respiratory distress CVS: Normal heart sounds. Murmurs - no Ext: Stigmata of Connective Tissue Disease - no HEENT: Normal upper airway. PEERL +. No post nasal drip. Some raspy voice  Allergies  Allergen Reactions   Codeine Nausea And Vomiting   Other Other (See Comments)   Sulfa Antibiotics Nausea And Vomiting   Penicillins Rash         Assessment:       ICD-10-CM   1. Moderate persistent asthmatic bronchitis with acute exacerbation  J45.41 Nitric oxide    CBC w/Diff    IgE    2. Eosinophilia, unspecified type  D72.10 CBC w/Diff    IgE    3. Elevated IgE level  R76.8 CBC w/Diff    IgE    4. Rhinitis, chronic  J31.0 CBC w/Diff    IgE         Plan:     Patient Instructions     ICD-10-CM   1. Moderate persistent asthmatic bronchitis with acute exacerbation  J45.41 Nitric oxide    2. Eosinophilia, unspecified type  D72.10     3. Elevated IgE level  R76.8     4. Rhinitis, chronic  J31.0      Having a case of acute on chronic bronchitis  Plan -Do CBC with differential and blood IgE   -Ideally do it before you take any steroids  -Results from this will help inform potential add-on treatments. - IM Depo-Medrol 80 mg x 1 dose in the office per your request  08/27/2023 - Then from 08/28/23  - Please take prednisone 40 mg x1 day, then 30 mg x1 day, then 20 mg x1 day, then 10 mg x1 day, and  STOP -Take doxycycline 100mg  po twice daily x 5 days; take after meals and avoid sunlight  Followup  - With Katie Cobb,OR Dr.  Sandrea Hughs  -Do recommend you discuss CT scan of the sinus with them given your chronic rhinitis.      FOLLOWUP Return for pevious per [employer]    SIGNATURE    Dr. Kalman Shan, M.D., F.C.C.P,  Pulmonary and Critical Care Medicine Staff Physician, [facility] Director - Interstitial Lung Disease  Program  Pulmonary Fibrosis [facility] at [facility] [place], [place], [address]  Pager: [phone], If no answer or between  15:00h - 7:00h: call [phone] Telephone: [phone]  3:44 PM 08/27/2023

## 2023-08-28 DIAGNOSIS — E1165 Type 2 diabetes mellitus with hyperglycemia: Secondary | ICD-10-CM | POA: Diagnosis not present

## 2023-08-28 LAB — CBC WITH DIFFERENTIAL/PLATELET
Basophils Absolute: 0.1 10*3/uL (ref 0.0–0.1)
Basophils Relative: 0.7 % (ref 0.0–3.0)
Eosinophils Absolute: 1.1 10*3/uL — ABNORMAL HIGH (ref 0.0–0.7)
Eosinophils Relative: 10.5 % — ABNORMAL HIGH (ref 0.0–5.0)
HCT: 45.3 % (ref 36.0–46.0)
Hemoglobin: 15.2 g/dL — ABNORMAL HIGH (ref 12.0–15.0)
Lymphocytes Relative: 18.4 % (ref 12.0–46.0)
Lymphs Abs: 1.9 10*3/uL (ref 0.7–4.0)
MCHC: 33.6 g/dL (ref 30.0–36.0)
MCV: 85.5 fL (ref 78.0–100.0)
Monocytes Absolute: 0.6 10*3/uL (ref 0.1–1.0)
Monocytes Relative: 6.1 % (ref 3.0–12.0)
Neutro Abs: 6.7 10*3/uL (ref 1.4–7.7)
Neutrophils Relative %: 64.3 % (ref 43.0–77.0)
Platelets: 256 10*3/uL (ref 150.0–400.0)
RBC: 5.29 Mil/uL — ABNORMAL HIGH (ref 3.87–5.11)
RDW: 14.4 % (ref 11.5–15.5)
WBC: 10.3 10*3/uL (ref 4.0–10.5)

## 2023-08-28 LAB — IGE: IgE (Immunoglobulin E), Serum: 673 kU/L — ABNORMAL HIGH (ref <=114)

## 2023-09-01 ENCOUNTER — Telehealth: Payer: Self-pay | Admitting: Internal Medicine

## 2023-09-01 MED ORDER — PREDNISONE 10 MG PO TABS: 10.0000 mg | ORAL_TABLET | Freq: Every day | ORAL | 0 refills | Status: DC

## 2023-09-01 NOTE — Telephone Encounter (Signed)
 Patient states was suppose to get quantity 10 Prednisone. Only got quantity 9. Patient phone number is [phone].

## 2023-09-01 NOTE — Telephone Encounter (Signed)
 Prednisone #9 sent to pharmacy 2/26. Dispense should have been #10. One additional tablet has been sent to preferred pharmacy.  Pt is aware and voiced her understanding.  Nothing further needed.

## 2023-09-01 NOTE — Addendum Note (Signed)
 Addended by: Lajoyce Lauber A on: 09/01/2023 09:40 AM   Modules accepted: Orders

## 2023-09-20 ENCOUNTER — Other Ambulatory Visit: Payer: Self-pay | Admitting: Nurse Practitioner

## 2023-09-20 DIAGNOSIS — J4540 Moderate persistent asthma, uncomplicated: Secondary | ICD-10-CM

## 2023-09-23 ENCOUNTER — Telehealth: Payer: Self-pay

## 2023-09-23 NOTE — Telephone Encounter (Signed)
 I was unable to find a dl for pt for CPAP. I called and lvm to pt asking her to bring her machine or sd card if she was still using it. Nfn

## 2023-09-24 ENCOUNTER — Ambulatory Visit: Payer: BC Managed Care – PPO | Admitting: Nurse Practitioner

## 2023-10-17 ENCOUNTER — Other Ambulatory Visit: Payer: Self-pay | Admitting: Nurse Practitioner

## 2023-10-17 DIAGNOSIS — J4540 Moderate persistent asthma, uncomplicated: Secondary | ICD-10-CM

## 2023-10-31 ENCOUNTER — Emergency Department (HOSPITAL_COMMUNITY)

## 2023-10-31 ENCOUNTER — Emergency Department (HOSPITAL_COMMUNITY)
Admission: EM | Admit: 2023-10-31 | Discharge: 2023-10-31 | Disposition: A | Discharge status: Home / Self Care | Attending: Emergency Medicine | Admitting: Emergency Medicine

## 2023-10-31 DIAGNOSIS — J45909 Unspecified asthma, uncomplicated: Secondary | ICD-10-CM | POA: Insufficient documentation

## 2023-10-31 DIAGNOSIS — I471 Supraventricular tachycardia, unspecified: Secondary | ICD-10-CM | POA: Diagnosis not present

## 2023-10-31 DIAGNOSIS — Z794 Long term (current) use of insulin: Secondary | ICD-10-CM | POA: Diagnosis not present

## 2023-10-31 DIAGNOSIS — R5383 Other fatigue: Secondary | ICD-10-CM | POA: Diagnosis not present

## 2023-10-31 DIAGNOSIS — F17210 Nicotine dependence, cigarettes, uncomplicated: Secondary | ICD-10-CM | POA: Insufficient documentation

## 2023-10-31 DIAGNOSIS — E119 Type 2 diabetes mellitus without complications: Secondary | ICD-10-CM | POA: Diagnosis not present

## 2023-10-31 DIAGNOSIS — R002 Palpitations: Secondary | ICD-10-CM | POA: Diagnosis not present

## 2023-10-31 DIAGNOSIS — J40 Bronchitis, not specified as acute or chronic: Secondary | ICD-10-CM | POA: Diagnosis not present

## 2023-10-31 DIAGNOSIS — I771 Stricture of artery: Secondary | ICD-10-CM | POA: Diagnosis not present

## 2023-10-31 LAB — COMPREHENSIVE METABOLIC PANEL WITH GFR
ALT: 27 U/L (ref 0–44)
AST: 20 U/L (ref 15–41)
Albumin: 4.2 g/dL (ref 3.5–5.0)
Alkaline Phosphatase: 114 U/L (ref 38–126)
Anion gap: 14 (ref 5–15)
BUN: 14 mg/dL (ref 6–20)
CO2: 20 mmol/L — ABNORMAL LOW (ref 22–32)
Calcium: 10.1 mg/dL (ref 8.9–10.3)
Chloride: 105 mmol/L (ref 98–111)
Creatinine, Ser: 0.91 mg/dL (ref 0.44–1.00)
GFR, Estimated: >60 mL/min (ref >60)
Glucose, Bld: 205 mg/dL — ABNORMAL HIGH (ref 70–99)
Potassium: 4.5 mmol/L (ref 3.5–5.1)
Sodium: 139 mmol/L (ref 135–145)
Total Bilirubin: 0.7 mg/dL (ref 0.0–1.2)
Total Protein: 7.4 g/dL (ref 6.5–8.1)

## 2023-10-31 LAB — CBC WITH DIFFERENTIAL/PLATELET
Abs Immature Granulocytes: 0.09 10*3/uL — ABNORMAL HIGH (ref 0.00–0.07)
Basophils Absolute: 0.1 10*3/uL (ref 0.0–0.1)
Basophils Relative: 1 %
Eosinophils Absolute: 0.4 10*3/uL (ref 0.0–0.5)
Eosinophils Relative: 3 %
HCT: 52 % — ABNORMAL HIGH (ref 36.0–46.0)
Hemoglobin: 17.3 g/dL — ABNORMAL HIGH (ref 12.0–15.0)
Immature Granulocytes: 1 %
Lymphocytes Relative: 16 %
Lymphs Abs: 2.3 10*3/uL (ref 0.7–4.0)
MCH: 28.9 pg (ref 26.0–34.0)
MCHC: 33.3 g/dL (ref 30.0–36.0)
MCV: 86.8 fL (ref 80.0–100.0)
Monocytes Absolute: 1 10*3/uL (ref 0.1–1.0)
Monocytes Relative: 7 %
Neutro Abs: 10.7 10*3/uL — ABNORMAL HIGH (ref 1.7–7.7)
Neutrophils Relative %: 72 %
Platelets: 362 10*3/uL (ref 150–400)
RBC: 5.99 MIL/uL — ABNORMAL HIGH (ref 3.87–5.11)
RDW: 14.2 % (ref 11.5–15.5)
WBC: 14.7 10*3/uL — ABNORMAL HIGH (ref 4.0–10.5)
nRBC: 0 % (ref 0.0–0.2)

## 2023-10-31 LAB — MAGNESIUM: Magnesium: 2.3 mg/dL (ref 1.7–2.4)

## 2023-10-31 LAB — TSH: TSH: 1.543 u[IU]/mL (ref 0.350–4.500)

## 2023-10-31 MED ORDER — ADENOSINE 6 MG/2ML IV SOLN
6.0000 mg | Freq: Once | INTRAVENOUS | Status: DC
  Filled 2023-10-31: qty 2

## 2023-10-31 MED ORDER — SODIUM CHLORIDE 0.9 % IV BOLUS
1000.0000 mL | Freq: Once | INTRAVENOUS | Status: AC
  Administered 2023-10-31: 1000 mL via INTRAVENOUS

## 2023-10-31 NOTE — Discharge Instructions (Addendum)
 You were seen in the ER today for concerns of heart palpitations. You were found to be in SVT which is  considerably elevated heart rate. You were able to have your heart rate improve with something called a "vagal maneuver" as well as fluids. From what we discussed, I would work to cut down your caffeine intake as this is likely contributing to your symptoms. I decided to place a referral to the cardiologist for you for further evaluation of this episode that you experienced. If you were to have this happen again, please return to the ER for reevaluation.

## 2023-10-31 NOTE — ED Triage Notes (Signed)
 Patient had feeling of heart racing this morning prior to going to work. Has felt generally fatigued x 2 days. Her coworkers took her vitals at work and showed her heart rate to be 197.

## 2023-10-31 NOTE — ED Notes (Signed)
 Nt called CCMD@ 3:00pm

## 2023-10-31 NOTE — ED Provider Notes (Signed)
 [place] EMERGENCY DEPARTMENT AT [facility] Provider Note   CSN: [id number] Arrival date & time: 10/31/23  1418     History  Chief Complaint  Patient presents with   Palpitations    Kristin Austin is a 58 y.o. female history of diabetes, asthma presented for SVT.  Patient that she was at work and around 10:00 today she started to feel like her heart was racing and her employees checked her heart rate and she was in the 190s.  Patient denies any heart history or previous history of SVT.  Patient does smoke cigarettes and has asthma however states she does not have any other pulmonary conditions.  Patient states that she feels that her heart is racing.  Patient denies any thyroid  history.  Home Medications Prior to Admission medications   Medication Sig Start Date End Date Taking? Authorizing Provider  albuterol  (ACCUNEB ) 1.25 MG/3ML nebulizer solution Take 1 ampule by nebulization every 4 (four) hours as needed for wheezing.    [provider]  albuterol  (VENTOLIN  HFA) 108 (90 Base) MCG/ACT inhaler INHALE 2 PUFFS BY MOUTH EVERY 6 HOURS AS NEEDED FOR WHEEZING OR SHORTNESS OF BREATH 10/18/23   Cobb, Mariah Shines, NP  benzonatate  (TESSALON ) 200 MG capsule Take 1 capsule (200 mg total) by mouth 3 (three) times daily as needed for cough. Patient not taking: Reported on 08/27/2023 07/08/23   Roetta Clarke, NP  doxycycline  (VIBRA -TABS) 100 MG tablet Take 1 tablet (100 mg total) by mouth 2 (two) times daily. 08/27/23   Maire Scot, MD  famotidine  (PEPCID ) 20 MG tablet TAKE 1 TABLET BY MOUTH AFTER SUPPER 08/11/23   Wert, Michael B, MD  ibuprofen  (ADVIL ,MOTRIN ) 800 MG tablet Take 1 tablet (800 mg total) by mouth 3 (three) times daily. Patient taking differently: Take 800 mg by mouth as needed. 03/30/16   Early Glisson, MD  insulin degludec (TRESIBA FLEXTOUCH) 100 UNIT/ML FlexTouch Pen 100 Units. 12/27/21   [provider]  montelukast  (SINGULAIR ) 10 MG tablet TAKE 1  TABLET BY MOUTH ONCE DAILY NIGHTLY AT BEDTIME 08/11/23   Wert, Michael B, MD  nystatin-triamcinolone (MYCOLOG II) cream nystatin-triamcinolone 100,000 unit/g-0.1 % topical cream  APPLY TO THE AFFECTED AREA BY TOPICAL ROUTE 2 TIMES PER DAY IN THE MORNING AND EVENING    [provider]  pantoprazole  (PROTONIX ) 40 MG tablet TAKE 1 TABLET BY MOUTH ONCE DAILY 30  TO  60  MINUTES  BEFORE  FIRST  MEAL  OF  THE  DAY. 02/26/22   Diamond Formica, MD  predniSONE  (DELTASONE ) 10 MG tablet Please take prednisone  40 mg x1 day, then 30 mg x1 day, then 20 mg x1 day, then 10 mg x1 day,  and stop 08/27/23   Maire Scot, MD  predniSONE  (DELTASONE ) 10 MG tablet Take 1 tablet (10 mg total) by mouth daily with breakfast. 09/01/23   Maire Scot, MD  promethazine -dextromethorphan (PROMETHAZINE -DM) 6.25-15 MG/5ML syrup Take 5 mLs by mouth 4 (four) times daily as needed for cough. 07/08/23   Cobb, Mariah Shines, NP  SYMBICORT  160-4.5 MCG/ACT inhaler Inhale 2 puffs into the lungs 2 (two) times daily. 07/08/23   Cobb, Mariah Shines, NP  Tiotropium Bromide Monohydrate  (SPIRIVA  RESPIMAT) 1.25 MCG/ACT AERS Inhale 2 puffs into the lungs daily. 07/08/23   Cobb, Mariah Shines, NP  triamcinolone (KENALOG) 0.025 % ointment triamcinolone acetonide 0.025 % topical ointment  APPLY OINTMENT TOPICALLY TO AFFECTED AREA TWICE DAILY AS NEEDED FOR ITCHING    [provider]  Allergies    Codeine , Other, Sulfa antibiotics, and Penicillins    Review of Systems   Review of Systems  Cardiovascular:  Positive for palpitations.    Physical Exam Updated Vital Signs BP 114/80 (BP Location: Right Arm)   Pulse 90   Temp 98 F (36.7 C)   Resp 19   LMP  (LMP Unknown)   SpO2 97%  Physical Exam Constitutional:      General: She is in acute distress.  Cardiovascular:     Rate and Rhythm: Regular rhythm. Tachycardia present.     Pulses: Normal pulses.     Heart sounds: Normal heart sounds.     Comments: SVT 190 Pulmonary:      Effort: Pulmonary effort is normal. No respiratory distress.     Breath sounds: Normal breath sounds.  Skin:    General: Skin is warm and dry.  Neurological:     Mental Status: She is alert.     ED Results / Procedures / Treatments   Labs (all labs ordered are listed, but only abnormal results are displayed) Labs Reviewed  COMPREHENSIVE METABOLIC PANEL WITH GFR  CBC WITH DIFFERENTIAL/PLATELET  MAGNESIUM    EKG EKG Interpretation Date/Time:  Friday Oct 31 2023 14:28:58 EDT Ventricular Rate:  194 PR Interval:    QRS Duration:  78 QT Interval:  258 QTC Calculation: 463 R Axis:   134  Text Interpretation: Supraventricular tachycardia Abnormal ECG No previous ECGs available Confirmed by Kommor, Madison ([id number]) on 10/31/2023 2:34:50 PM  Radiology No results found.  Procedures .Critical Care  Performed by: Denese Finn, PA-C Authorized by: Denese Finn, PA-C   Critical care provider statement:    Critical care time (minutes):  30   Critical care time was exclusive of:  Separately billable procedures and treating other patients   Critical care was necessary to treat or prevent imminent or life-threatening deterioration of the following conditions: SVT.   Critical care was time spent personally by me on the following activities:  Blood draw for specimens, development of treatment plan with patient or surrogate, evaluation of patient's response to treatment, examination of patient, obtaining history from patient or surrogate, review of old charts, re-evaluation of patient's condition, pulse oximetry and ordering and performing treatments and interventions   I assumed direction of critical care for this patient from another provider in my specialty: no     Care discussed with comment:  Oncoming team     Medications Ordered in ED Medications  adenosine  (ADENOCARD ) 6 MG/2ML injection 6 mg (has no administration in time range)  sodium chloride  0.9 % bolus 1,000 mL (has  no administration in time range)    ED Course/ Medical Decision Making/ A&P                                 Medical Decision Making Amount and/or Complexity of Data Reviewed Labs: ordered.  Risk Prescription drug management.   Monque D Niebuhr 58 y.o. presented today for SVT. Working DDx that I considered at this time includes, but not limited to, electrolyte abnormalities, arrhythmias, dehydration, PE, thyrotoxicosis, pneumonia.  R/o DDx: Pending  Review of prior external notes: 08/28/2023 office visit  Unique Tests and My Independent Interpretation:  EKG: SVT 194 bpm Chest x-ray: Pending CBC: Pending CMP: Pending Magnesium: Pending TSH: Pending  Social Determinants of Health: none  Discussion with Independent Historian:  Family Member  Discussion of Management of  Tests: None  Risk: Low: based on diagnostic testing/clinical impression and treatment plan  Risk Stratification Score: none  Staffed with Kommor, MD  Plan: On exam patient was acute distress and noted to be in SVT at a rate of 190 when I was in the room.  Patient's heart rate ranged from 180-200.  Patient is no previous history of this.  Patient states she feels her heart is racing and is unsure why this started.  Patient denies any recent illnesses with me or thyroid  issues.  I was able to have the patient vagal and converted her to sinus tachycardia in which her heart rate began to range from 111-130 sinus tachycardia in the room.  At this time we will get labs and give fluids to see why patient went into SVT.  Will hold off on adenosine .  Patient signed out to Zelaya, PA-C.  Please review their note for the continuation of patient's care.  The plan at this point is follow-up on labs to determine why patient went SVT.  This chart was dictated using voice recognition software.  Despite best efforts to proofread,  errors can occur which can change the documentation meaning.         Final Clinical  Impression(s) / ED Diagnoses Final diagnoses:  None    Rx / DC Orders ED Discharge Orders     None         Elex Grimmer 10/31/23 1505    Karlyn Overman, MD 10/31/23 [phone]

## 2023-10-31 NOTE — ED Provider Notes (Signed)
 Accepted handoff at shift change from Schuman, PA-C. Please see prior provider note for more detail.   Briefly: Patient is 58 y.o.   DDX: concern for SVT, VT, atrial fibrillation, heart block  Plan: disposition per patient condition and lab workup  Physical Exam  BP 108/74   Pulse 98   Temp 97.9 F (36.6 C) (Oral)   Resp 19   LMP  (LMP Unknown)   SpO2 97%   Physical Exam Constitutional:      General: She is not in acute distress.    Appearance: Normal appearance. She is not ill-appearing.  Cardiovascular:     Rate and Rhythm: Normal rate and regular rhythm.     Pulses: Normal pulses.     Heart sounds: Normal heart sounds. No murmur heard.    No friction rub. No gallop.     Comments: NSR at 95 Pulmonary:     Effort: Pulmonary effort is normal. No respiratory distress.     Breath sounds: Normal breath sounds.  Skin:    General: Skin is warm and dry.  Neurological:     Mental Status: She is alert.     Procedures  Procedures  ED Course / MDM    Medical Decision Making Amount and/or Complexity of Data Reviewed Labs: ordered. Radiology: ordered.   Patient has remained in NSR or low level sinus tachycardia. At this time, suspect that a combination of mild dehydration and caffeine intake contributed to patient's episode of ventricular tachycardia.  Labs are largely unremarkable regarding explanation of her symptoms. At this time, advised patient to reduce caffeine intake and avoid potentially aggravating factors such as alcohol and tobacco use. Discussed return precautions such as concerns for new or worsening symptoms. She is otherwise stable at this time for outpatient follow up and discharged home.    Kristin Dee, PA-C 10/31/23 Dorma Gash, MD 10/31/23 [phone]

## 2023-11-12 ENCOUNTER — Other Ambulatory Visit: Payer: Self-pay | Admitting: Internal Medicine

## 2023-12-02 DIAGNOSIS — E1165 Type 2 diabetes mellitus with hyperglycemia: Secondary | ICD-10-CM | POA: Diagnosis not present

## 2023-12-03 DIAGNOSIS — E1165 Type 2 diabetes mellitus with hyperglycemia: Secondary | ICD-10-CM | POA: Diagnosis not present

## 2023-12-03 LAB — HEMOGLOBIN A1C: Hemoglobin A1C: 6.1

## 2023-12-04 LAB — COMPREHENSIVE METABOLIC PANEL WITH GFR
Albumin: 4.3 (ref 3.5–5.0)
Calcium: 9.6 (ref 8.7–10.7)
Globulin: 2.6
eGFR: 95

## 2023-12-04 LAB — LIPID PANEL
Cholesterol: 229 — AB (ref 0–200)
HDL: 32 — AB (ref 35–70)
LDL Cholesterol: 160
Triglycerides: 198 — AB (ref 40–160)

## 2023-12-04 LAB — BASIC METABOLIC PANEL WITH GFR
BUN: 10 (ref 4–21)
CO2: 20 (ref 13–22)
Chloride: 104 (ref 99–108)
Creatinine: 0.7 (ref 0.5–1.1)
Glucose: 139
Potassium: 4 meq/L (ref 3.5–5.1)
Sodium: 141 (ref 137–147)

## 2023-12-04 LAB — MICROALBUMIN / CREATININE URINE RATIO
Albumin, Urine POC: 9.2
Creatinine, POC: 130.7 mg/dL
Microalb Creat Ratio: 7

## 2023-12-04 LAB — PROTEIN / CREATININE RATIO, URINE
Albumin, U: 9.2
Creatinine, Urine: 130.7

## 2023-12-04 LAB — HEPATIC FUNCTION PANEL
ALT: 21 U/L (ref 7–35)
AST: 20 (ref 13–35)
Alkaline Phosphatase: 155 — AB (ref 25–125)
Bilirubin, Total: 0.3

## 2023-12-25 LAB — HM DIABETES EYE EXAM

## 2023-12-26 ENCOUNTER — Ambulatory Visit: Admitting: Nurse Practitioner

## 2023-12-27 ENCOUNTER — Other Ambulatory Visit: Payer: Self-pay | Admitting: Nurse Practitioner

## 2023-12-27 DIAGNOSIS — J4540 Moderate persistent asthma, uncomplicated: Secondary | ICD-10-CM

## 2023-12-29 ENCOUNTER — Ambulatory Visit: Admitting: Nurse Practitioner

## 2023-12-29 ENCOUNTER — Encounter: Payer: Self-pay | Admitting: Nurse Practitioner

## 2023-12-29 VITALS — BP 122/76 | HR 89 | Temp 97.8°F | Resp 18 | Ht 63.0 in | Wt 189.0 lb

## 2023-12-29 DIAGNOSIS — L409 Psoriasis, unspecified: Secondary | ICD-10-CM | POA: Diagnosis not present

## 2023-12-29 DIAGNOSIS — Z9109 Other allergy status, other than to drugs and biological substances: Secondary | ICD-10-CM | POA: Diagnosis not present

## 2023-12-29 DIAGNOSIS — F17200 Nicotine dependence, unspecified, uncomplicated: Secondary | ICD-10-CM

## 2023-12-29 DIAGNOSIS — E1169 Type 2 diabetes mellitus with other specified complication: Secondary | ICD-10-CM

## 2023-12-29 DIAGNOSIS — E119 Type 2 diabetes mellitus without complications: Secondary | ICD-10-CM | POA: Insufficient documentation

## 2023-12-29 DIAGNOSIS — E785 Hyperlipidemia, unspecified: Secondary | ICD-10-CM

## 2023-12-29 DIAGNOSIS — J4540 Moderate persistent asthma, uncomplicated: Secondary | ICD-10-CM | POA: Diagnosis not present

## 2023-12-29 DIAGNOSIS — I471 Supraventricular tachycardia, unspecified: Secondary | ICD-10-CM

## 2023-12-29 MED ORDER — ROSUVASTATIN CALCIUM 10 MG PO TABS: 10.0000 mg | ORAL_TABLET | Freq: Every day | ORAL | Status: AC

## 2023-12-29 NOTE — Progress Notes (Unsigned)
 Careteam: Patient Care Team: [name], NP as PCP - General (Geriatric Medicine)  PLACE OF SERVICE:  [facility]  Advanced Directive information    Allergies  Allergen Reactions   Codeine  Nausea And Vomiting   Sulfa Antibiotics Nausea And Vomiting   Penicillins Rash    Chief Complaint  Patient presents with   Establish Care    New patient    HPI:  Discussed the use of AI scribe software for clinical note transcription with the patient, who gave verbal consent to proceed.  History of Present Illness Kristin Austin is a 58 year old female who presents to establish care.  She has a history of asthma, initially suspected to be COPD, but later attributed to allergies. She experiences frequent coughing and phlegm production, which she attributes to smoking. She uses Symbicort  and Spiriva  inhalers, two puffs twice daily, and has a nebulizer that needs replacement. She also takes Singulair  10 mg daily for asthma and uses promethazine  dextromethorphan syrup as needed for cough. She has multiple allergies, including to penicillin, Vicodin, codeine , and sulfa drugs, causing upset stomach and nausea. She also has environmental allergies to mold, grass, and dogs.  She has type 2 diabetes and is under the care of an endocrinologist. Her last A1c was 6.1% in early June. She takes Mounjaro 7.5 mg weekly, transitioning to 10 mg, Jardiance 25 mg daily, and Tresiba 8 units daily. She missed her Tresiba dose this morning due to a disrupted routine. No retinopathy or kidney damage from diabetes.  She has a history of psoriasis, for which she uses triamcinolone ointment as needed. She has not seen a dermatologist recently but had a previous issue with a rash that was biopsied without a definitive diagnosis.  She reports a history of mild depression, which she manages without medication. She experiences significant stress and has not yet started Wellbutrin , which was previously prescribed to  help with smoking cessation  She has a significant smoking history, smoking a pack a day for 34 years. She acknowledges the impact on her lung health and has attempted to quit in the past.  She experienced a heart rate increase to 107 bpm in early May, leading to a visit to the hospital where she was not admitted overnight. She is scheduled to see a cardiologist at the end of August. An x-ray at that time showed chronic bronchitis.   Review of Systems:  Review of Systems  Constitutional:  Negative for chills, fever and weight loss.  HENT:  Negative for tinnitus.   Respiratory:  Positive for cough and sputum production. Negative for shortness of breath.   Cardiovascular:  Negative for chest pain, palpitations and leg swelling.  Gastrointestinal:  Negative for abdominal pain, constipation, diarrhea and heartburn.  Genitourinary:  Negative for dysuria, frequency and urgency.  Musculoskeletal:  Negative for back pain, falls, joint pain and myalgias.  Skin: Negative.   Neurological:  Negative for dizziness and headaches.  Psychiatric/Behavioral:  Negative for depression and memory loss. The patient does not have insomnia.     Past Medical History:  Diagnosis Date   Allergic rhinitis    Asthma    Depression    Diabetes mellitus without complication (HCC)    OSA on CPAP    Psoriasis    Past Surgical History:  Procedure Laterality Date   COLONOSCOPY     Social History:   reports that she has been smoking cigarettes. She has a 34 pack-year smoking history. She has never  used smokeless tobacco. She reports that she does not drink alcohol and does not use drugs.  Family History  Problem Relation Age of Onset   Cancer Mother    Diabetes Mellitus II Mother    Hypertension Mother    Cancer Father 91       prostate   Prostate cancer Father    Diabetes Mellitus II Father    Hypertension Sister    Allergic rhinitis Neg Hx    Asthma Neg Hx    Eczema Neg Hx    Urticaria Neg Hx      Medications: Patient's Medications  New Prescriptions   ROSUVASTATIN (CRESTOR) 10 MG TABLET    Take 1 tablet (10 mg total) by mouth daily.  Previous Medications   ALBUTEROL  (ACCUNEB ) 1.25 MG/3ML NEBULIZER SOLUTION    Take 1 ampule by nebulization every 4 (four) hours as needed for wheezing.   CONTINUOUS GLUCOSE SENSOR (FREESTYLE LIBRE 3 SENSOR) MISC    SMARTSIG:1 Every 2 Weeks   FAMOTIDINE  (PEPCID ) 20 MG TABLET    TAKE 1 TABLET BY MOUTH AFTER SUPPER   INSULIN DEGLUDEC (TRESIBA FLEXTOUCH) 100 UNIT/ML FLEXTOUCH PEN    Inject 8 Units into the skin daily.   JARDIANCE 25 MG TABS TABLET    Take 25 mg by mouth daily.   MONTELUKAST  (SINGULAIR ) 10 MG TABLET    TAKE 1 TABLET BY MOUTH ONCE DAILY NIGHTLY AT BEDTIME   MOUNJARO 10 MG/0.5ML PEN    Inject 10 mg into the skin once a week.   PANTOPRAZOLE  (PROTONIX ) 40 MG TABLET    TAKE 1 TABLET BY MOUTH ONCE DAILY 30  TO  60  MINUTES  BEFORE  FIRST  MEAL  OF  THE  DAY.   PROMETHAZINE -DEXTROMETHORPHAN (PROMETHAZINE -DM) 6.25-15 MG/5ML SYRUP    Take 5 mLs by mouth 4 (four) times daily as needed for cough.   SYMBICORT  160-4.5 MCG/ACT INHALER    Inhale 2 puffs into the lungs 2 (two) times daily.   TIOTROPIUM BROMIDE MONOHYDRATE  (SPIRIVA  RESPIMAT) 1.25 MCG/ACT AERS    Inhale 2 puffs into the lungs daily.   TRIAMCINOLONE (KENALOG) 0.025 % OINTMENT    triamcinolone acetonide 0.025 % topical ointment  APPLY OINTMENT TOPICALLY TO AFFECTED AREA TWICE DAILY AS NEEDED FOR ITCHING  Modified Medications   Modified Medication Previous Medication   ALBUTEROL  (VENTOLIN  HFA) 108 (90 BASE) MCG/ACT INHALER albuterol  (VENTOLIN  HFA) 108 (90 Base) MCG/ACT inhaler      INHALE 2 PUFFS BY MOUTH EVERY 6 HOURS AS NEEDED FOR WHEEZING OR SHORTNESS OF BREATH    INHALE 2 PUFFS BY MOUTH EVERY 6 HOURS AS NEEDED FOR WHEEZING OR SHORTNESS OF BREATH  Discontinued Medications   BENZONATATE  (TESSALON ) 200 MG CAPSULE    Take 1 capsule (200 mg total) by mouth 3 (three) times daily as needed for  cough.   DOXYCYCLINE  (VIBRA -TABS) 100 MG TABLET    Take 1 tablet (100 mg total) by mouth 2 (two) times daily.   IBUPROFEN  (ADVIL ,MOTRIN ) 800 MG TABLET    Take 1 tablet (800 mg total) by mouth 3 (three) times daily.   NYSTATIN-TRIAMCINOLONE (MYCOLOG II) CREAM    nystatin-triamcinolone 100,000 unit/g-0.1 % topical cream  APPLY TO THE AFFECTED AREA BY TOPICAL ROUTE 2 TIMES PER DAY IN THE MORNING AND EVENING   PREDNISONE  (DELTASONE ) 10 MG TABLET    Please take prednisone  40 mg x1 day, then 30 mg x1 day, then 20 mg x1 day, then 10 mg x1 day,  and stop   PREDNISONE  (  DELTASONE ) 10 MG TABLET    Take 1 tablet (10 mg total) by mouth daily with breakfast.    Physical Exam:  Vitals:   12/29/23 0913  BP: 122/76  Pulse: 89  Resp: 18  Temp: 97.8 F (36.6 C)  SpO2: 96%  Weight: 189 lb (85.7 kg)  Height: 5' 3 (1.6 m)   Body mass index is 33.48 kg/m. Wt Readings from Last 3 Encounters:  12/29/23 189 lb (85.7 kg)  07/08/23 205 lb 3.2 oz (93.1 kg)  05/28/22 188 lb (85.3 kg)    Physical Exam Constitutional:      General: She is not in acute distress.    Appearance: She is well-developed. She is not diaphoretic.  HENT:     Head: Normocephalic and atraumatic.     Mouth/Throat:     Pharynx: No oropharyngeal exudate.  Eyes:     Conjunctiva/sclera: Conjunctivae normal.     Pupils: Pupils are equal, round, and reactive to light.  Cardiovascular:     Rate and Rhythm: Normal rate and regular rhythm.     Heart sounds: Normal heart sounds.  Pulmonary:     Effort: Pulmonary effort is normal.     Breath sounds: Normal breath sounds.  Abdominal:     General: Bowel sounds are normal.     Palpations: Abdomen is soft.  Musculoskeletal:     Cervical back: Normal range of motion and neck supple.     Right lower leg: No edema.     Left lower leg: No edema.  Skin:    General: Skin is warm and dry.  Neurological:     Mental Status: She is alert.  Psychiatric:        Mood and Affect: Mood normal.      Labs reviewed: Basic Metabolic Panel: Recent Labs    07/16/23 0000 10/31/23 1442 12/04/23 0000  NA 136*  136* 139 141  K 4.6  4.6 4.5 4.0  CL 97*  97* 105 104  CO2 21  21 20* 20  GLUCOSE  --  205*  --   BUN 19  14 14 10   CREATININE 0.7  0.7 0.91 0.7  CALCIUM 10.0  10.0 10.1 9.6  MG  --  2.3  --   TSH  --  1.543  --    Liver Function Tests: Recent Labs    07/16/23 0000 10/31/23 1442 12/04/23 0000  AST 36* 20 20  ALT 45* 27 21  ALKPHOS 167* 114 155*  BILITOT  --  0.7  --   PROT  --  7.4  --   ALBUMIN 4.5  4.5 4.2 4.3   No results for input(s): LIPASE, AMYLASE in the last 8760 hours. No results for input(s): AMMONIA in the last 8760 hours. CBC: Recent Labs    08/27/23 1606 10/31/23 1442  WBC 10.3 14.7*  NEUTROABS 6.7 10.7*  HGB 15.2* 17.3*  HCT 45.3 52.0*  MCV 85.5 86.8  PLT 256.0 362   Lipid Panel: Recent Labs    07/16/23 0000 12/04/23 0000  CHOL 301* 229*  HDL 35 32*  LDLCALC 203 160  TRIG 309* 198*   TSH: Recent Labs    10/31/23 1442  TSH 1.543   A1C: Lab Results  Component Value Date   HGBA1C 6.1 12/04/2023     Assessment/Plan Moderate persistent asthma, unspecified whether complicated Assessment & Plan: Ongoing symptoms not well controlled.  Continues to follow up with pulmonary, stressed smoking cessation    Type 2 diabetes mellitus  with other specified complication, without long-term current use of insulin [facility]) Assessment & Plan: Continue jardiance and mounjaro with tresiba, followed by endocrine. Encouraged dietary compliance, routine foot care/monitoring and to keep up with diabetic eye exams through ophthalmology    Psoriasis Assessment & Plan: Continues to follow up with derm. Has triamcinolone cream for flares.    Environmental allergies Assessment & Plan: Has seen allergist who recommended treatment options but she is not sure she was willing to start medication. Continues on singulair .     Current smoker Assessment & Plan: Smoking cessation discussed in details.  Discussed starting Wellbutrin  which she has not yet started.   Orders: -     Ambulatory referral to Virtual Care Smoking Cessation  Hyperlipidemia LDL goal <70 Assessment & Plan: Cholesterol has been significantly elevated, recently crestor has been added. Encouraged to take nightly, will need to recheck lipids and cmp with next visit.    SVT (supraventricular tachycardia) (HCC) Assessment & Plan: Recently diagnosised, has evaluation scheduled with cardiology    Other orders -     Rosuvastatin Calcium; Take 1 tablet (10 mg total) by mouth daily.    Return in about 6 weeks (around 02/09/2024) for CPE, labs at time of visit.:  Breylin Dom K. Caro BODILY [facility] [phone]

## 2023-12-29 NOTE — Patient Instructions (Addendum)
 To sign record release from previous PCP   To sign record release from ophthalmology  To sign record release for endocrinology.   To use mucinex DM by mouth twice daily- take with full glass of water

## 2023-12-31 DIAGNOSIS — I471 Supraventricular tachycardia, unspecified: Secondary | ICD-10-CM | POA: Insufficient documentation

## 2023-12-31 NOTE — Assessment & Plan Note (Signed)
 Has seen allergist who recommended treatment options but she is not sure she was willing to start medication. Continues on singulair .

## 2023-12-31 NOTE — Assessment & Plan Note (Signed)
 Continue jardiance and mounjaro with tresiba, followed by endocrine. Encouraged dietary compliance, routine foot care/monitoring and to keep up with diabetic eye exams through ophthalmology

## 2023-12-31 NOTE — Assessment & Plan Note (Signed)
 Cholesterol has been significantly elevated, recently crestor has been added. Encouraged to take nightly, will need to recheck lipids and cmp with next visit.

## 2023-12-31 NOTE — Assessment & Plan Note (Signed)
 Recently diagnosised, has evaluation scheduled with cardiology

## 2023-12-31 NOTE — Assessment & Plan Note (Signed)
 Ongoing symptoms not well controlled.  Continues to follow up with pulmonary, stressed smoking cessation

## 2023-12-31 NOTE — Assessment & Plan Note (Signed)
 Continues to follow up with derm. Has triamcinolone cream for flares.

## 2023-12-31 NOTE — Assessment & Plan Note (Signed)
 Smoking cessation discussed in details.  Discussed starting Wellbutrin  which she has not yet started.

## 2024-01-05 ENCOUNTER — Ambulatory Visit: Payer: Self-pay | Admitting: Nurse Practitioner

## 2024-01-21 ENCOUNTER — Other Ambulatory Visit: Payer: Self-pay | Admitting: Nurse Practitioner

## 2024-01-21 DIAGNOSIS — J4540 Moderate persistent asthma, uncomplicated: Secondary | ICD-10-CM

## 2024-02-05 ENCOUNTER — Other Ambulatory Visit: Payer: Self-pay | Admitting: Nurse Practitioner

## 2024-02-06 ENCOUNTER — Ambulatory Visit: Admitting: Nurse Practitioner

## 2024-02-10 ENCOUNTER — Other Ambulatory Visit: Payer: Self-pay | Admitting: Nurse Practitioner

## 2024-02-10 DIAGNOSIS — J4540 Moderate persistent asthma, uncomplicated: Secondary | ICD-10-CM

## 2024-02-13 ENCOUNTER — Ambulatory Visit: Admitting: Nurse Practitioner

## 2024-02-24 ENCOUNTER — Other Ambulatory Visit (HOSPITAL_COMMUNITY): Payer: Self-pay

## 2024-02-24 ENCOUNTER — Ambulatory Visit

## 2024-02-24 ENCOUNTER — Encounter: Payer: Self-pay | Admitting: Cardiovascular Disease

## 2024-02-24 ENCOUNTER — Ambulatory Visit: Attending: Cardiovascular Disease | Admitting: Cardiovascular Disease

## 2024-02-24 VITALS — BP 150/80 | HR 89 | Ht 63.0 in | Wt 184.4 lb

## 2024-02-24 DIAGNOSIS — I471 Supraventricular tachycardia, unspecified: Secondary | ICD-10-CM | POA: Diagnosis not present

## 2024-02-24 DIAGNOSIS — E782 Mixed hyperlipidemia: Secondary | ICD-10-CM

## 2024-02-24 DIAGNOSIS — Z720 Tobacco use: Secondary | ICD-10-CM

## 2024-02-24 MED ORDER — DILTIAZEM HCL 60 MG PO TABS
60.0000 mg | ORAL_TABLET | Freq: Every day | ORAL | 3 refills | Status: AC | PRN
  Filled 2024-02-24: qty 30, 30d supply, fill #0

## 2024-02-24 NOTE — Patient Instructions (Signed)
 Medication Instructions:  AS NEEDED - Diltiazem  60 mg once daily for a heart rate greater than 140 and/ or palpitations  *If you need a refill on your cardiac medications before your next appointment, please call your pharmacy*  Lab Work: None ordered today. If you have labs (blood work) drawn today and your tests are completely normal, you will receive your results only by: MyChart Message (if you have MyChart) OR A paper copy in the mail If you have any lab test that is abnormal or we need to change your treatment, we will call you to review the results.  Testing/Procedures: Your physician has requested that you wear a Zio heart monitor for 14 days. This will be mailed to your home with instructions on how to apply the monitor and how to return it when finished. Please allow 2 weeks after returning the heart monitor before our office calls you with the results.   Your physician has requested that you have an echocardiogram. Echocardiography is a painless test that uses sound waves to create images of your heart. It provides your doctor with information about the size and shape of your heart and how well your heart's chambers and valves are working. This procedure takes approximately one hour. There are no restrictions for this procedure. Please do NOT wear cologne, perfume, aftershave, or lotions (deodorant is allowed). Please arrive 15 minutes prior to your appointment time.  Please note: We ask at that you not bring children with you during ultrasound (echo/ vascular) testing. Due to room size and safety concerns, children are not allowed in the ultrasound rooms during exams. Our front office staff cannot provide observation of children in our lobby area while testing is being conducted. An adult accompanying a patient to their appointment will only be allowed in the ultrasound room at the discretion of the ultrasound technician under special circumstances. We apologize for any inconvenience.    Follow-Up: At [facility], you and your health needs are our priority.  As part of our continuing mission to provide you with exceptional heart care, our providers are all part of one team.  This team includes your primary Cardiologist (physician) and Advanced Practice Providers or APPs (Physician Assistants and Nurse Practitioners) who all work together to provide you with the care you need, when you need it.  Your next appointment:   1 year(s)  Provider:   Ozell Fell, MD    We recommend signing up for the patient portal called MyChart.  Sign up information is provided on this After Visit Summary.  MyChart is used to connect with patients for Virtual Visits (Telemedicine).  Patients are able to view lab/test results, encounter notes, upcoming appointments, etc.  Non-urgent messages can be sent to your provider as well.   To learn more about what you can do with MyChart, go to [email or web address].   Other Instructions ZIO XT- Long Term Monitor Instructions  Your physician has requested you wear a ZIO patch monitor for 14 days.   This is a single patch monitor. [employer] supplies one patch monitor per enrollment. Additional  stickers are not available. Please do not apply patch if you will be having a Nuclear Stress Test,  Echocardiogram, Cardiac CT, MRI, or Chest Xray during the period you would be wearing the  monitor. The patch cannot be worn during these tests. You cannot remove and re-apply the  ZIO XT patch monitor.   Your ZIO patch monitor will be mailed 3 day USPS to your address  on file. It may take 3-5 days  to receive your monitor after you have been enrolled.   Once you have received your monitor, please review the enclosed instructions. Your monitor  has already been registered assigning a specific monitor serial # to you.     Billing and Patient Assistance Program Information  We have supplied [employer] with any of your insurance information on file for  billing purposes.  [employer] offers a sliding scale Patient Assistance Program for patients that do not have  insurance, or whose insurance does not completely cover the cost of the ZIO monitor.  You must apply for the Patient Assistance Program to qualify for this discounted rate.   To apply, please call [employer] at [phone], select option 4, select option 2, ask to apply for  Patient Assistance Program. Meredeth will ask your household income, and how many people  are in your household. They will quote your out-of-pocket cost based on that information.  [employer] will also be able to set up a 63-month, interest-free payment plan if needed.     Applying the monitor  Shave hair from upper left chest.  Hold abrader disc by orange tab. Rub abrader in 40 strokes over the upper left chest as  indicated in your monitor instructions.  Clean area with 4 enclosed alcohol pads. Let dry.  Apply patch as indicated in monitor instructions. Patch will be placed under collarbone on left  side of chest with arrow pointing upward.  Rub patch adhesive wings for 2 minutes. Remove white label marked 1. Remove the white  label marked 2. Rub patch adhesive wings for 2 additional minutes.  While looking in a mirror, press and release button in center of patch. A small green light will  flash 3-4 times. This will be your only indicator that the monitor has been turned on.  Do not shower for the first 24 hours. You may shower after the first 24 hours.  Press the button if you feel a symptom. You will hear a small click. Record Date, Time and  Symptom in the Patient Logbook.  When you are ready to remove the patch, follow instructions on the last 2 pages of Patient  Logbook. Stick patch monitor onto the last page of Patient Logbook.   Place Patient Logbook in the blue and white box. Use locking tab on box and tape box closed  securely. The blue and white box has prepaid postage on it. Please place it in the  mailbox as  soon as possible. Your physician should have your test results approximately 7 days after the  monitor has been mailed back to [facility].   Call [facility] Customer Care at [phone] if you have questions regarding  your ZIO XT patch monitor. Call them immediately if you see an orange light blinking on your  monitor.   If your monitor falls off in less than 4 days, contact our Monitor department at [phone].   If your monitor becomes loose or falls off after 4 days call [employer] at [phone] for  suggestions on securing your monitor.

## 2024-02-24 NOTE — Progress Notes (Signed)
 Cardiology Office Note:    Date:  02/24/2024   ID:  Kristin Austin, DOB 12-16-1965, MRN [id number]  PCP:  Kristin Harlene POUR, NP   [facility] Providers Cardiologist:  None     Referring MD: Marvine Rush, MD   Chief Complaint  Patient presents with   Palpitations    History of Present Illness:    Kristin Austin is a 58 y.o. female presenting for evaluation of SVT.  The patient was evaluated in the emergency department Oct 31, 2023 when she began to feel like her heart was racing and her coworkers noted that her heart rate was in the 190s when they checked it.  The patient previously underwent cardiac evaluation in 2020 when she was seen for evaluation of chest tightness and shortness of breath.  Symptoms were felt to likely be related to smoking and COPD but she did undergo an echocardiogram and a nuclear stress test.  Echocardiogram showed normal LVEF 60 to 65%, normal RV function, no evidence of mitral valve disease, no evidence of tricuspid regurgitation, and no evidence of aortic valve disease.  Stress testing was normal with normal LVEF and no perfusion abnormalities.  The patient is here alone today. On the day she was seen in the ER, she was having an normal day. She began feeling weak and fatigued with a feeling of racing heartbeats. Initial EKG showed SVT at a HR of 194 bpm. Follow-up EKG showed sinus tach at 127 bpm. She tells me that she converted with a Valsalva maneuver. She's had episodes of 'heart racing' at other times, but never to the intensity level of this event.   The patient drinks 2 cups of coffee in the morning and Diet Dr Nunzio in the afternoon. She's trying to drink more water. She's a regular daily smoker.   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)    Per [facility]    Past Surgical History:  Procedure Laterality Date   COLONOSCOPY  03/25/2018    Current Medications: Current Meds  Medication Sig   albuterol  (ACCUNEB ) 1.25 MG/3ML nebulizer solution Take 1 ampule by nebulization every 4 (four) hours as needed for wheezing.   albuterol  (VENTOLIN  HFA) 108 (90 Base) MCG/ACT inhaler INHALE 2 PUFFS BY MOUTH EVERY 6 HOURS AS NEEDED FOR WHEEZING OR SHORTNESS OF BREATH   Continuous Glucose Sensor (FREESTYLE LIBRE 3 SENSOR) MISC SMARTSIG:1 Every 2 Weeks   famotidine  (PEPCID ) 20 MG tablet TAKE 1 TABLET BY MOUTH AFTER SUPPER   insulin degludec (TRESIBA FLEXTOUCH) 100 UNIT/ML  FlexTouch Pen Inject 8 Units into the skin daily.   JARDIANCE 25 MG TABS tablet Take 25 mg by mouth daily.   montelukast  (SINGULAIR ) 10 MG tablet TAKE 1 TABLET BY MOUTH ONCE DAILY NIGHTLY AT BEDTIME   MOUNJARO 10 MG/0.5ML Pen Inject 10 mg into the skin once a week.   pantoprazole  (PROTONIX ) 40 MG tablet TAKE 1 TABLET BY MOUTH ONCE DAILY 30  TO  60  MINUTES  BEFORE  FIRST  MEAL  OF  THE  DAY.   promethazine -dextromethorphan (PROMETHAZINE -DM) 6.25-15 MG/5ML syrup Take 5 mLs by mouth 4 (four) times daily as needed for cough.   RESTASIS 0.05 % ophthalmic emulsion 1 drop 2 (two) times daily.   SYMBICORT  160-4.5 MCG/ACT inhaler Inhale 2 puffs  into the lungs 2 (two) times daily.   Tiotropium Bromide Monohydrate  (SPIRIVA  RESPIMAT) 1.25 MCG/ACT AERS Inhale 2 puffs into the lungs daily.   triamcinolone cream (KENALOG) 0.1 % Apply topically 2 (two) times daily as needed.     Allergies:   Cipro [ciprofloxacin hcl], Codeine , Sulfa antibiotics, and Penicillins   Social History   Socioeconomic History   Marital status: Divorced    Spouse name: Not on file   Number of children: Not on file   Years of education: Not on file   Highest education level: Not on file  Occupational History   Not on file  Tobacco Use   Smoking status: Every Day    Current packs/day: 1.00    Average packs/day: 1 pack/day for 34.0 years (34.0 ttl pk-yrs)    Types: Cigarettes   Smokeless tobacco: Never   Tobacco comments:    1 ppd 04/19/2022 PAP  Vaping Use   Vaping status: Never Used  Substance and Sexual Activity   Alcohol use: No   Drug use: No   Sexual activity: Not on file  Other Topics Concern   Not on file  Social History Narrative   Not on file   Social Drivers of Health   Financial Resource Strain: Not on file  Food Insecurity: Not on file  Transportation Needs: Not on file  Physical Activity: Not on file  Stress: Not on file  Social Connections: Not on file     Family History: The patient's family history includes Alzheimer's disease in her maternal grandfather; Breast cancer in her maternal grandmother; Cancer in her mother; Cancer (age of onset: 18) in her father; Diabetes Mellitus II in her father and mother; Emphysema in her paternal grandfather; Hypertension in her mother and sister; Prostate cancer in her father. There is no history of Allergic rhinitis, Asthma, Eczema, or Urticaria.  ROS:   Please see the history of present illness.    All other systems reviewed and are negative.  EKGs/Labs/Other Studies Reviewed:    The following studies were reviewed today: EKG Interpretation Date/Time:  Tuesday February 24 2024  15:05:59 EDT Ventricular Rate:  91 PR Interval:  158 QRS Duration:  76 QT Interval:  376 QTC Calculation: 462 R Axis:   159  Text Interpretation: Normal sinus rhythm Right axis deviation Pulmonary disease pattern Nonspecific ST abnormality When compared with ECG of 31-Oct-2023 14:51, PREVIOUS ECG IS PRESENT the heart rate is slower, no other significant changes Confirmed by Wonda Sharper [phone]) on 02/24/2024 3:15:50 PM    Recent Labs: 10/31/2023: Hemoglobin 17.3; Magnesium 2.3; Platelets 362; TSH 1.543 12/04/2023: ALT 21; BUN 10; Creatinine 0.7; Potassium 4.0; Sodium 141  Recent Lipid Panel    Component Value Date/Time   CHOL 229 (  A) 12/04/2023 0000   TRIG 198 (A) 12/04/2023 0000   HDL 32 (A) 12/04/2023 0000   LDLCALC 160 12/04/2023 0000           Physical Exam:    VS:  BP (!) 150/80   Pulse 89   Ht 5' 3 (1.6 m)   Wt 184 lb 6.4 oz (83.6 kg)   LMP  (LMP Unknown)   SpO2 96%   BMI 32.66 kg/m     Wt Readings from Last 3 Encounters:  02/24/24 184 lb 6.4 oz (83.6 kg)  12/29/23 189 lb (85.7 kg)  07/08/23 205 lb 3.2 oz (93.1 kg)     GEN:  Well nourished, well developed in no acute distress HEENT: Normal NECK: No JVD; No carotid bruits LYMPHATICS: No lymphadenopathy CARDIAC: RRR, no murmurs, rubs, gallops RESPIRATORY:  Clear to auscultation without rales, wheezing or rhonchi  ABDOMEN: Soft, non-tender, non-distended MUSCULOSKELETAL:  No edema; No deformity  SKIN: Warm and dry NEUROLOGIC:  Alert and oriented x 3 PSYCHIATRIC:  Normal affect   ASSESSMENT:    1. SVT (supraventricular tachycardia) (HCC)   2. Tobacco abuse   3. Mixed hyperlipidemia    PLAN:    In order of problems listed above:  I reviewed her EKG which showed narrow complex tachycardia at 194 bpm, suspect AVNRT.  She has had no clear recurrence and seems to be doing well at present.  I recommended diltiazem  short acting 60 mg as needed for tachycardia/palpitations if heart rate is greater than 140  bpm.  I recommended an echocardiogram to evaluate for any structural abnormality.  She does have right axis on her EKG suggestive of pulmonary disease pattern.  I will also check a 14-day ZIO monitor.  Plan 1 year follow-up.  We discussed other potential treatment options which would include scheduled AV nodal blockade or EP referral for consideration of catheter ablation.  With a one-time event, as needed calcium  channel blocker  seems appropriate.  We also discussed use of Valsalva maneuver at home since this was effective for her in the emergency department. Tobacco cessation counseling done Discussed use of a statin drug.  Her LDL cholesterol is 160.  She has type 2 diabetes.  This has been prescribed by her endocrinologist but she was reluctant to start it.  I confirmed the recommendation for lipid-lowering therapy in the setting of her diabetes and hyperlipidemia and she will start taking rosuvastatin  10 mg daily.           Medication Adjustments/Labs and Tests Ordered: Current medicines are reviewed at length with the patient today.  Concerns regarding medicines are outlined above.  Orders Placed This Encounter  Procedures   EKG 12-Lead   No orders of the defined types were placed in this encounter.   There are no Patient Instructions on file for this visit.   Signed, Ozell Fell, MD  02/24/2024 3:37 PM    [facility]

## 2024-02-24 NOTE — Progress Notes (Unsigned)
 ZIO serial # [id number] from office inventory given to patient.

## 2024-03-07 ENCOUNTER — Ambulatory Visit: Payer: Self-pay | Admitting: Internal Medicine

## 2024-03-07 NOTE — Progress Notes (Signed)
 My apologies for delayed release obut over 4 years your blood IgE is high AND your blood EOSINOPHILS are high. These are causing active airway inflammation. Thre is now therapies to target this and make you feel better . Please do make an appointment iwht Dr Darlean or one his nurse practitioners to go over this. It is iimportant  Thanks  Dr JONELLE

## 2024-03-15 DIAGNOSIS — I471 Supraventricular tachycardia, unspecified: Secondary | ICD-10-CM | POA: Diagnosis not present

## 2024-03-23 DIAGNOSIS — I471 Supraventricular tachycardia, unspecified: Secondary | ICD-10-CM | POA: Diagnosis not present

## 2024-03-26 ENCOUNTER — Ambulatory Visit (HOSPITAL_COMMUNITY)
Admission: RE | Admit: 2024-03-26 | Discharge: 2024-03-26 | Disposition: A | Discharge status: Home / Self Care | Source: Ambulatory Visit | Attending: Cardiovascular Disease | Admitting: Cardiovascular Disease

## 2024-03-26 ENCOUNTER — Ambulatory Visit: Admitting: Nurse Practitioner

## 2024-03-26 DIAGNOSIS — I471 Supraventricular tachycardia, unspecified: Secondary | ICD-10-CM | POA: Diagnosis not present

## 2024-03-26 LAB — ECHOCARDIOGRAM COMPLETE
AR max vel: 3.36 cm2
AV Area VTI: 3.1 cm2
AV Area mean vel: 3.12 cm2
AV Mean grad: 1 mmHg
AV Peak grad: 2.8 mmHg
Ao pk vel: 0.83 m/s
Area-P 1/2: 4.49 cm2

## 2024-03-26 MED ORDER — PERFLUTREN LIPID MICROSPHERE
1.0000 mL | INTRAVENOUS | Status: AC | PRN
Start: 2024-03-26 — End: 2024-03-26
  Administered 2024-03-26: 3 mL via INTRAVENOUS

## 2024-03-26 NOTE — Addendum Note (Signed)
 Encounter addended by: Sharlet Cherene LABOR, RDMS on: 03/26/2024 4:13 PM  Actions taken: Imaging Exam ended, MAR administration accepted

## 2024-03-26 NOTE — Addendum Note (Signed)
 Encounter addended by: Sharlet Cherene LABOR, RDMS on: 03/26/2024 4:09 PM  Actions taken: Imaging Exam begun

## 2024-03-29 ENCOUNTER — Ambulatory Visit: Payer: Self-pay | Admitting: Cardiovascular Disease

## 2024-03-31 DIAGNOSIS — M67441 Ganglion, right hand: Secondary | ICD-10-CM | POA: Diagnosis not present

## 2024-04-23 ENCOUNTER — Ambulatory Visit: Admitting: Nurse Practitioner

## 2024-05-06 ENCOUNTER — Other Ambulatory Visit: Payer: Self-pay | Admitting: Internal Medicine

## 2024-07-21 ENCOUNTER — Encounter: Payer: Self-pay | Admitting: Family

## 2024-07-21 ENCOUNTER — Ambulatory Visit: Admitting: Family

## 2024-07-21 VITALS — BP 124/74 | HR 93 | Temp 97.6°F | Ht 63.0 in | Wt 188.4 lb

## 2024-07-21 DIAGNOSIS — J4540 Moderate persistent asthma, uncomplicated: Secondary | ICD-10-CM

## 2024-07-21 DIAGNOSIS — R21 Rash and other nonspecific skin eruption: Secondary | ICD-10-CM

## 2024-07-21 MED ORDER — NYSTATIN 100000 UNIT/GM EX CREA: 1.0000 | TOPICAL_CREAM | Freq: Two times a day (BID) | CUTANEOUS | 0 refills | Status: AC

## 2024-07-21 MED ORDER — PREDNISONE 20 MG PO TABS: ORAL_TABLET | ORAL | 0 refills | Status: AC

## 2024-07-21 MED ORDER — ALBUTEROL SULFATE 1.25 MG/3ML IN NEBU: 1.0000 | INHALATION_SOLUTION | RESPIRATORY_TRACT | 3 refills | Status: AC | PRN

## 2024-07-21 NOTE — Patient Instructions (Signed)
 Take extra 2 units of Treshiba if blood sugars are greater than 150 while on prednisone 

## 2024-07-22 ENCOUNTER — Ambulatory Visit: Admitting: Adult Health

## 2024-07-24 ENCOUNTER — Emergency Department (HOSPITAL_COMMUNITY)
Admission: EM | Admit: 2024-07-24 | Discharge: 2024-07-24 | Disposition: A | Discharge status: Home / Self Care | Attending: Emergency Medicine | Admitting: Emergency Medicine

## 2024-07-24 ENCOUNTER — Encounter (HOSPITAL_COMMUNITY): Payer: Self-pay | Admitting: *Deleted

## 2024-07-24 ENCOUNTER — Other Ambulatory Visit: Payer: Self-pay

## 2024-07-24 DIAGNOSIS — R739 Hyperglycemia, unspecified: Secondary | ICD-10-CM | POA: Insufficient documentation

## 2024-07-24 DIAGNOSIS — Z760 Encounter for issue of repeat prescription: Secondary | ICD-10-CM | POA: Diagnosis not present

## 2024-07-24 LAB — CBG MONITORING, ED: Glucose-Capillary: 120 mg/dL — ABNORMAL HIGH (ref 70–99)

## 2024-07-24 MED ORDER — TRESIBA FLEXTOUCH 100 UNIT/ML ~~LOC~~ SOPN: 10.0000 [IU] | PEN_INJECTOR | Freq: Every day | SUBCUTANEOUS | 0 refills | Status: AC

## 2024-07-24 NOTE — Discharge Instructions (Signed)
 Follow-up closely with your primary care doctor and endocrinologist on an outpatient basis.  Return to emergency department immediately for any new or worsening symptoms.

## 2024-07-24 NOTE — ED Provider Notes (Signed)
 " [place] EMERGENCY DEPARTMENT AT [facility] Provider Note   CSN: [id number] Arrival date & time: 07/24/24  9261     Patient presents with: Medication Refill   Kristin Austin is a 59 y.o. female.   Patient is a 59 year old female who presents emergency department needing a refill of her long-acting insulin.  Patient notes that she accidentally did not refill it before the refill date.  She also notes that she has been on prednisone  and has not checked her sugar.  She has no other active complaints at this time.   Medication Refill      Prior to Admission medications  Medication Sig Start Date End Date Taking? Authorizing Provider  albuterol  (ACCUNEB ) 1.25 MG/3ML nebulizer solution Take 3 mLs (1.25 mg total) by nebulization every 4 (four) hours as needed for wheezing. 07/21/24   Ngetich, Dinah C, NP  albuterol  (PROVENTIL ) (2.5 MG/3ML) 0.083% nebulizer solution Take 2.5 mg by nebulization every 6 (six) hours as needed for wheezing or shortness of breath.    [provider]  albuterol  (VENTOLIN  HFA) 108 (90 Base) MCG/ACT inhaler INHALE 2 PUFFS BY MOUTH EVERY 6 HOURS AS NEEDED FOR WHEEZING OR SHORTNESS OF BREATH 02/10/24   Geronimo Amel, MD  Continuous Glucose Sensor (FREESTYLE LIBRE 3 SENSOR) MISC SMARTSIG:1 Every 2 Weeks    [provider]  diltiazem  (CARDIZEM ) 60 MG tablet Take 1 tablet (60 mg total) by mouth daily as needed. 02/24/24   Wonda Sharper, MD  famotidine  (PEPCID ) 20 MG tablet TAKE 1 TABLET BY MOUTH AFTER SUPPER 05/07/24   Geronimo Amel, MD  ibuprofen  (ADVIL ) 800 MG tablet Take 800 mg by mouth every 8 (eight) hours as needed. 01/29/24   [provider]  insulin degludec  (TRESIBA  FLEXTOUCH) 100 UNIT/ML FlexTouch Pen Inject 10 Units into the skin daily. 07/24/24   Daralene Lonni BIRCH, PA-C  JARDIANCE 25 MG TABS tablet Take 25 mg by mouth daily.    [provider]  montelukast  (SINGULAIR ) 10 MG tablet TAKE 1 TABLET BY MOUTH  ONCE DAILY NIGHTLY AT BEDTIME 05/07/24   Geronimo Amel, MD  MOUNJARO 10 MG/0.5ML Pen Inject 10 mg into the skin once a week. 04/25/22   [provider]  nystatin  cream (MYCOSTATIN ) Apply 1 Application topically 2 (two) times daily. 07/21/24   Ngetich, Dinah C, NP  pantoprazole  (PROTONIX ) 40 MG tablet TAKE 1 TABLET BY MOUTH ONCE DAILY 30  TO  60  MINUTES  BEFORE  FIRST  MEAL  OF  THE  DAY. 02/26/22   Darlean Sharper NOVAK, MD  predniSONE  (DELTASONE ) 20 MG tablet Take 2 tablets (40 mg total) by mouth daily with breakfast for 1 day, THEN 1.5 tablets (30 mg total) daily with breakfast for 1 day, THEN 1 tablet (20 mg total) daily with breakfast for 1 day, THEN 0.5 tablets (10 mg total) daily with breakfast for 1 day. 07/21/24 07/25/24  Ngetich, Dinah C, NP  promethazine -dextromethorphan (PROMETHAZINE -DM) 6.25-15 MG/5ML syrup Take 5 mLs by mouth 4 (four) times daily as needed for cough. 07/08/23   Cobb, Comer GAILS, NP  RESTASIS 0.05 % ophthalmic emulsion 1 drop 2 (two) times daily. 12/25/23   [provider]  rosuvastatin  (CRESTOR ) 10 MG tablet Take 1 tablet (10 mg total) by mouth daily. 12/29/23   [name], NP  SYMBICORT  160-4.5 MCG/ACT inhaler Inhale 2 puffs into the lungs 2 (two) times daily. 07/08/23   Cobb, Comer GAILS, NP  Tiotropium Bromide Monohydrate  (SPIRIVA  RESPIMAT) 1.25 MCG/ACT AERS  Inhale 2 puffs into the lungs daily. 07/08/23   Cobb, Comer GAILS, NP  triamcinolone cream (KENALOG) 0.1 % Apply topically 2 (two) times daily as needed. 10/20/23   [provider]    Allergies: Cipro [ciprofloxacin hcl], Codeine , Sulfa antibiotics, and Penicillins    Review of Systems  All other systems reviewed and are negative.   Updated Vital Signs BP 127/78   Pulse 85   Temp 97.8 F (36.6 C) (Oral)   Resp 18   Ht 5' 3 (1.6 m)   Wt 85.3 kg   LMP  (LMP Unknown)   SpO2 92%   BMI 33.30 kg/m   Physical Exam Vitals and nursing note reviewed.  Constitutional:      General:  She is not in acute distress.    Appearance: Normal appearance. She is not ill-appearing.  HENT:     Head: Normocephalic and atraumatic.  Eyes:     Extraocular Movements: Extraocular movements intact.     Conjunctiva/sclera: Conjunctivae normal.     Pupils: Pupils are equal, round, and reactive to light.  Cardiovascular:     Rate and Rhythm: Normal rate and regular rhythm.     Pulses: Normal pulses.     Heart sounds: Normal heart sounds. No murmur heard.    No gallop.  Pulmonary:     Effort: Pulmonary effort is normal. No respiratory distress.     Breath sounds: Normal breath sounds. No wheezing or rales.  Musculoskeletal:        General: Normal range of motion.  Skin:    General: Skin is warm and dry.  Neurological:     General: No focal deficit present.     Mental Status: She is alert and oriented to person, place, and time. Mental status is at baseline.  Psychiatric:        Mood and Affect: Mood normal.        Behavior: Behavior normal.        Thought Content: Thought content normal.        Judgment: Judgment normal.     (all labs ordered are listed, but only abnormal results are displayed) Labs Reviewed  CBG MONITORING, ED - Abnormal; Notable for the following components:      Result Value   Glucose-Capillary 120 (*)    All other components within normal limits  CBG MONITORING, ED    EKG: None  Radiology: No results found.   Procedures   Medications Ordered in the ED - No data to display                                  Medical Decision Making Patient is doing well at this time and is stable for discharge home.  Blood glucose is currently 120 at this time.  Did call in a refill of her long-acting insulin.  She will continue to monitor her blood sugars at home.  She already has follow-up with her endocrinologist in 1 week.  Strict turn precautions were discussed for any new or worsening symptoms.  Patient voiced understanding and had no additional  questions.  Risk Prescription drug management.        Final diagnoses:  Medication refill    ED Discharge Orders          Ordered    insulin degludec  (TRESIBA  FLEXTOUCH) 100 UNIT/ML FlexTouch Pen  Daily        07/24/24 0916  Daralene Lonni BIRCH, PA-C 07/24/24 9080    Suzette Pac, MD 07/24/24 2039  "

## 2024-07-24 NOTE — ED Triage Notes (Signed)
 Pt states she needs a refill on her [facility]  Pt states she has been on prednisone  x 3 days for a rash and has not been checking her sugars

## 2024-07-26 NOTE — Progress Notes (Signed)
 "  Provider: Tino Ronan FNP-C  [name], NP  Patient Care Team: [name], NP as PCP - General (Geriatric Medicine) Wonda Sharper, MD as PCP - Cardiology (Cardiology)  Extended Emergency Contact Information Primary Emergency Contact: BROWN,SCOTT Mobile Phone: [phone] Relation: Other Interpreter needed? No  Code Status:  Full Code  Goals of care: Advanced Directive information    07/24/2024    8:00 AM  Advanced Directives  Does Patient Have a Medical Advance Directive? No  Would patient like information on creating a medical advance directive? No - Patient declined     Chief Complaint  Patient presents with   Rash    Patient has several different rashes: under stomach, arm, groin area, etc.   medication management of chronic issues.     Patient requests new nebulizer machine and solution. Patient has been using more often due to cough.     History of Present Illness   Romi D Tumblin is a 59 year old female with diabetes who presents with a rash.  She has a rash that began a few weeks ago, initially appearing on her tailbone and subsequently spreading to the groin area, under the right breast, and down to the top of her foot. The rash is intensely pruritic, described as 'itching like crazy,' but there is no associated pus. She recalls experiencing a similar rash after a trip to [place] about a year ago, for which she consulted a dermatologist. Biopsies were performed at that time, but the cause was undetermined. She was prescribed ivermectin and prednisone , neither of which provided significant relief. The rash tends to improve and then flare up again. She has not used any creams recently for the current rash but mentions having used a cream in the past.  She has diabetes and is currently on Mounjaro, Jandiaz, and Trospium. She monitors her blood sugar daily and adjusts her insulin dosage as needed. She is concerned about the impact of prednisone  on her  blood sugar levels.  She experiences breathing difficulties and a persistent cough, which she attributes to smoking. She has attempted to quit smoking and has been prescribed Wellbutrin  in the past, though she is not currently taking it. She has asthma and uses Spiriva , Symbicort , and albuterol . She has requested a new nebulizer and albuterol  solution as her current supply is expired.   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    Per [facility]   Mixed hyperlipidemia    Per [facility]   Morbid obesity [facility])    Per [facility]   Non-compliance    Per [facility]   Obstructive sleep apnea (adult) (pediatric)    Per [facility]   OSA on CPAP    Other iron deficiency anemias    Per [facility]   Psoriasis    Pure hyperglyceridemia    Per [facility]   Type 2 diabetes mellitus with hyperglycemia [facility])    Per [facility]   Past Surgical History:  Procedure Laterality Date   COLONOSCOPY  03/25/2018    Allergies[1]  Outpatient  Encounter Medications as of 07/21/2024  Medication Sig   albuterol  (PROVENTIL ) (2.5 MG/3ML) 0.083% nebulizer solution Take 2.5 mg by nebulization every 6 (six) hours as needed for wheezing or shortness of breath.   albuterol  (VENTOLIN  HFA) 108 (90 Base) MCG/ACT inhaler INHALE 2 PUFFS BY MOUTH EVERY 6 HOURS AS NEEDED FOR WHEEZING OR SHORTNESS OF BREATH   Continuous Glucose Sensor (FREESTYLE LIBRE 3 SENSOR) MISC SMARTSIG:1 Every 2 Weeks   diltiazem  (CARDIZEM ) 60 MG tablet Take 1 tablet (60 mg total) by mouth  daily as needed.   famotidine  (PEPCID ) 20 MG tablet TAKE 1 TABLET BY MOUTH AFTER SUPPER   ibuprofen  (ADVIL ) 800 MG tablet Take 800 mg by mouth every 8 (eight) hours as needed.   JARDIANCE 25 MG TABS tablet Take 25 mg by mouth daily.   montelukast  (SINGULAIR ) 10 MG tablet TAKE 1 TABLET BY MOUTH ONCE DAILY NIGHTLY AT BEDTIME   MOUNJARO 10 MG/0.5ML Pen Inject 10 mg into the skin once a week.   nystatin  cream (MYCOSTATIN ) Apply 1 Application topically 2 (two) times daily.   pantoprazole  (PROTONIX ) 40 MG tablet TAKE 1 TABLET BY MOUTH ONCE DAILY 30  TO  60  MINUTES  BEFORE  FIRST  MEAL  OF  THE  DAY.   [EXPIRED] predniSONE  (DELTASONE ) 20 MG tablet Take 2 tablets (40 mg total) by mouth daily with breakfast for 1 day, THEN 1.5 tablets (30 mg total) daily with breakfast for 1 day, THEN 1 tablet (20 mg total) daily with breakfast for 1 day, THEN 0.5 tablets (10 mg total) daily with breakfast for 1 day.   promethazine -dextromethorphan (PROMETHAZINE -DM) 6.25-15 MG/5ML syrup Take 5 mLs by mouth 4 (four) times daily as needed for cough.   RESTASIS 0.05 % ophthalmic emulsion 1 drop 2 (two) times daily.   rosuvastatin  (CRESTOR ) 10 MG tablet Take 1 tablet (10 mg total) by mouth daily.   SYMBICORT  160-4.5 MCG/ACT inhaler Inhale 2 puffs into the lungs 2 (two) times daily.   Tiotropium Bromide Monohydrate  (SPIRIVA  RESPIMAT) 1.25 MCG/ACT AERS Inhale 2 puffs into the lungs daily.   triamcinolone cream (KENALOG) 0.1 % Apply topically 2 (two) times daily as needed.   [DISCONTINUED] albuterol  (ACCUNEB ) 1.25 MG/3ML nebulizer solution Take 1 ampule by nebulization every 4 (four) hours as needed for wheezing.   [DISCONTINUED] insulin degludec  (TRESIBA  FLEXTOUCH) 100 UNIT/ML FlexTouch Pen Inject 8 Units into the skin daily.   albuterol  (ACCUNEB ) 1.25 MG/3ML nebulizer solution Take 3 mLs (1.25 mg total) by nebulization every 4 (four) hours as needed for wheezing.   No facility-administered encounter medications on file as of  07/21/2024.    Review of Systems  Constitutional:  Negative for appetite change, chills, fatigue, fever and unexpected weight change.  HENT:  Negative for congestion, ear discharge, ear pain, hearing loss, nosebleeds, postnasal drip, rhinorrhea, sinus pressure, sinus pain, sneezing, sore throat, tinnitus and trouble swallowing.   Eyes:  Negative for pain, discharge, redness, itching and visual disturbance.  Respiratory:  Negative for chest tightness, shortness of breath and wheezing.        Chronic cough   Cardiovascular:  Negative for chest pain, palpitations and leg swelling.  Gastrointestinal:  Negative for abdominal distention, abdominal pain, constipation, diarrhea, nausea and vomiting.  Endocrine: Negative for cold intolerance, heat intolerance, polydipsia, polyphagia and polyuria.  Genitourinary:  Negative for difficulty urinating, dysuria, flank pain, frequency and urgency.  Musculoskeletal:  Negative for arthralgias, back pain, gait problem, joint swelling and myalgias.  Skin:  Positive for rash. Negative  for color change, pallor and wound.  Neurological:  Negative for dizziness, weakness, light-headedness, numbness and headaches.    Immunization History  Administered Date(s) Administered   PFIZER(Purple Top)SARS-COV-2 Vaccination 01/27/2020, 02/18/2020   Tdap 08/13/2019   Pertinent  Health Maintenance Due  Topic Date Due   FOOT EXAM  Never done   HEMOGLOBIN A1C  06/03/2024   Mammogram  07/12/2024   OPHTHALMOLOGY EXAM  12/24/2024   Colonoscopy  03/24/2028   Influenza Vaccine  Discontinued      03/30/2019    9:27 AM 07/08/2023    9:05 AM 12/29/2023    9:16 AM 07/21/2024    1:53 PM  Fall Risk  Falls in the past year? 0  0 0 1  Was there an injury with Fall? 0   0  0  Fall Risk Category Calculator 0  0 1  Fall Risk Category (Retired) Low      (RETIRED) Patient Fall Risk Level Low fall risk      Patient at Risk for Falls Due to   No Fall Risks No Fall Risks  Fall risk  Follow up   Falls evaluation completed Falls evaluation completed     Data saved with a previous flowsheet row definition   Functional Status Survey:    Vitals:   07/21/24 1400  BP: 124/74  Pulse: 93  Temp: 97.6 F (36.4 C)  SpO2: 97%  Weight: 188 lb 6.4 oz (85.5 kg)  Height: 5' 3 (1.6 m)   Body mass index is 33.37 kg/m. Physical Exam Physical Exam   GENERAL: Alert, cooperative, well developed, no acute distress. HEENT: Normocephalic, normal oropharynx, moist mucous membranes. CHEST: Clear to auscultation bilaterally, no wheezes, rhonchi, or crackles. CARDIOVASCULAR: Normal heart rate and rhythm, S1 and S2 normal without murmurs. ABDOMEN: Soft, non-tender, non-distended, without organomegaly, normal bowel sounds. EXTREMITIES: No cyanosis or edema. NEUROLOGICAL: Cranial nerves grossly intact, moves all extremities without gross motor or sensory deficit. SKIN: Rash present on groin area, extends to coccyx area, under right breast, and to top of foot. No rash on left side of breast or under armpits.    Labs reviewed: Recent Labs    10/31/23 1442 12/04/23 0000  NA 139 141  K 4.5 4.0  CL 105 104  CO2 20* 20  GLUCOSE 205*  --   BUN 14 10  CREATININE 0.91 0.7  CALCIUM  10.1 9.6  MG 2.3  --    Recent Labs    10/31/23 1442 12/04/23 0000  AST 20 20  ALT 27 21  ALKPHOS 114 155*  BILITOT 0.7  --   PROT 7.4  --   ALBUMIN 4.2 4.3   Recent Labs    08/27/23 1606 10/31/23 1442  WBC 10.3 14.7*  NEUTROABS 6.7 10.7*  HGB 15.2* 17.3*  HCT 45.3 52.0*  MCV 85.5 86.8  PLT 256.0 362   Lab Results  Component Value Date   TSH 1.543 10/31/2023   Lab Results  Component Value Date   HGBA1C 6.1 12/03/2023   Lab Results  Component Value Date   CHOL 229 (A) 12/04/2023   HDL 32 (A) 12/04/2023   LDLCALC 160 12/04/2023   TRIG 198 (A) 12/04/2023    Significant Diagnostic Results in last 30 days:  No results found.  Assessment/Plan  Rash and nonspecific skin  eruption Rash present for a few weeks, located on the groin, tailbone, and extending to the top of the foot. It is intensely pruritic with no purulent discharge. Previous treatment with  ivermectin and prednisone  provided temporary relief. Differential includes possible yeast infection due to skin folds. - Prescribed prednisone  with a tapering dose starting with two pills, then reducing to one and a half, and continuing to taper until completion. - Prescribed nystatin  cream for application on the groin area and under the abdominal fold to alleviate itching. - Advised to monitor blood glucose levels daily and adjust Tresiba  dosage if blood glucose exceeds 150-200 mg/dL during prednisone  treatment.  Moderate persistent asthma Asthma with moderate persistent symptoms, including coughing and dyspnea. She is attempting to quit smoking, which may improve symptoms. Current medications include Spiriva , Symbicort , and albuterol  inhaler. She requires a new nebulizer and albuterol  nebulizer solution due to expired supplies. - Prescribed albuterol  nebulizer solution for use with a new nebulizer machine. - Provided prescription for a new nebulizer machine. - Encouraged smoking cessation to improve respiratory symptoms.   Family/ staff Communication: Reviewed plan of care with patient verbalized understanding   Labs/tests ordered: None   Next Appointment: Return in about 1 month (around 08/21/2024) for medical mangement of chronic issues with Harlene Caro PIETY .   Total time: 20 minutes. Greater than 50% of total time spent doing patient education regarding Rash and nonspecific skin eruption,Moderate persistent asthmahealth maintenance including symptom/medication management.   Mckaylie Vasey C Kazden Largo, NP    [1]  Allergies Allergen Reactions   Cipro [Ciprofloxacin Hcl]     Per [facility]   Codeine  Nausea And Vomiting   Sulfa Antibiotics Nausea And Vomiting   Penicillins Rash   "

## 2024-08-05 ENCOUNTER — Other Ambulatory Visit: Payer: Self-pay | Admitting: Internal Medicine

## 2024-08-23 ENCOUNTER — Ambulatory Visit: Admitting: Nurse Practitioner

## 2024-09-21 ENCOUNTER — Encounter: Admitting: Pulmonary Disease
